# Patient Record
Sex: Male | Born: 1970 | Race: White | Hispanic: No | Marital: Single | Smoking: Never smoker
Health system: Southern US, Community
[De-identification: ages and names within clinical notes are randomized; demographics above are authoritative.]

## PROBLEM LIST (undated history)

## (undated) DIAGNOSIS — E781 Pure hyperglyceridemia: Secondary | ICD-10-CM

## (undated) DIAGNOSIS — I10 Essential (primary) hypertension: Secondary | ICD-10-CM

## (undated) DIAGNOSIS — I456 Pre-excitation syndrome: Secondary | ICD-10-CM

## (undated) DIAGNOSIS — K5730 Diverticulosis of large intestine without perforation or abscess without bleeding: Secondary | ICD-10-CM

## (undated) DIAGNOSIS — K602 Anal fissure, unspecified: Secondary | ICD-10-CM

## (undated) DIAGNOSIS — B001 Herpesviral vesicular dermatitis: Secondary | ICD-10-CM

## (undated) DIAGNOSIS — K589 Irritable bowel syndrome without diarrhea: Secondary | ICD-10-CM

## (undated) HISTORY — DX: Pre-excitation syndrome: I45.6

## (undated) HISTORY — DX: Irritable bowel syndrome, unspecified: K58.9

## (undated) HISTORY — DX: Diverticulosis of large intestine without perforation or abscess without bleeding: K57.30

## (undated) HISTORY — PX: OTHER SURGICAL HISTORY: SHX169

## (undated) HISTORY — DX: Herpesviral vesicular dermatitis: B00.1

## (undated) HISTORY — DX: Anal fissure, unspecified: K60.2

## (undated) HISTORY — DX: Essential (primary) hypertension: I10

## (undated) HISTORY — DX: Pure hyperglyceridemia: E78.1

---
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
## 1997-08-19 ENCOUNTER — Emergency Department (HOSPITAL_COMMUNITY): Admission: EM | Admit: 1997-08-19 | Discharge: 1997-08-19 | Payer: Self-pay | Admitting: Emergency Medicine

## 2001-02-01 ENCOUNTER — Encounter: Admission: RE | Admit: 2001-02-01 | Discharge: 2001-02-01 | Payer: Self-pay | Admitting: Gastroenterology

## 2001-02-01 ENCOUNTER — Encounter: Payer: Self-pay | Admitting: Gastroenterology

## 2003-09-07 ENCOUNTER — Emergency Department (HOSPITAL_COMMUNITY): Admission: EM | Admit: 2003-09-07 | Discharge: 2003-09-07 | Payer: Self-pay | Admitting: Family Medicine

## 2005-12-03 ENCOUNTER — Ambulatory Visit: Payer: Self-pay | Admitting: Family Medicine

## 2007-11-01 ENCOUNTER — Ambulatory Visit: Payer: Self-pay | Admitting: Family Medicine

## 2009-05-15 ENCOUNTER — Ambulatory Visit: Payer: Self-pay | Admitting: Family Medicine

## 2009-06-13 ENCOUNTER — Ambulatory Visit: Payer: Self-pay | Admitting: Family Medicine

## 2009-12-10 ENCOUNTER — Ambulatory Visit: Payer: Self-pay | Admitting: Family Medicine

## 2010-03-17 ENCOUNTER — Ambulatory Visit
Admission: RE | Admit: 2010-03-17 | Discharge: 2010-03-17 | Payer: Self-pay | Source: Home / Self Care | Attending: Family Medicine | Admitting: Family Medicine

## 2010-10-29 ENCOUNTER — Encounter: Payer: Self-pay | Admitting: Family Medicine

## 2011-03-03 ENCOUNTER — Ambulatory Visit (INDEPENDENT_AMBULATORY_CARE_PROVIDER_SITE_OTHER): Payer: BC Managed Care – PPO | Admitting: Medical

## 2011-03-03 ENCOUNTER — Encounter: Payer: Self-pay | Admitting: Medical

## 2011-03-03 DIAGNOSIS — R6882 Decreased libido: Secondary | ICD-10-CM | POA: Insufficient documentation

## 2011-03-03 DIAGNOSIS — R209 Unspecified disturbances of skin sensation: Secondary | ICD-10-CM

## 2011-03-03 DIAGNOSIS — R202 Paresthesia of skin: Secondary | ICD-10-CM | POA: Insufficient documentation

## 2011-03-03 DIAGNOSIS — Z0000 Encounter for general adult medical examination without abnormal findings: Secondary | ICD-10-CM | POA: Insufficient documentation

## 2011-03-03 DIAGNOSIS — E781 Pure hyperglyceridemia: Secondary | ICD-10-CM | POA: Insufficient documentation

## 2011-03-03 DIAGNOSIS — Z113 Encounter for screening for infections with a predominantly sexual mode of transmission: Secondary | ICD-10-CM | POA: Insufficient documentation

## 2011-03-03 LAB — CBC WITH DIFFERENTIAL/PLATELET
Basophils Absolute: 0.1 10*3/uL (ref 0.0–0.1)
Basophils Relative: 1 % (ref 0–1)
Eosinophils Absolute: 0.1 10*3/uL (ref 0.0–0.7)
Eosinophils Relative: 2 % (ref 0–5)
HCT: 46.2 % (ref 39.0–52.0)
Hemoglobin: 15.8 g/dL (ref 13.0–17.0)
Lymphocytes Relative: 43 % (ref 12–46)
Lymphs Abs: 2.4 10*3/uL (ref 0.7–4.0)
MCH: 33 pg (ref 26.0–34.0)
MCHC: 34.2 g/dL (ref 30.0–36.0)
MCV: 96.5 fL (ref 78.0–100.0)
Monocytes Absolute: 0.6 10*3/uL (ref 0.1–1.0)
Monocytes Relative: 11 % (ref 3–12)
Neutro Abs: 2.5 10*3/uL (ref 1.7–7.7)
Neutrophils Relative %: 44 % (ref 43–77)
Platelets: 177 10*3/uL (ref 150–400)
RBC: 4.79 MIL/uL (ref 4.22–5.81)
RDW: 12.1 % (ref 11.5–15.5)
WBC: 5.6 10*3/uL (ref 4.0–10.5)

## 2011-03-03 LAB — LIPID PANEL
Cholesterol: 237 mg/dL — ABNORMAL HIGH (ref 0–200)
HDL: 39 mg/dL — ABNORMAL LOW (ref >39)
LDL Cholesterol: 136 mg/dL — ABNORMAL HIGH (ref 0–99)
Total CHOL/HDL Ratio: 6.1 Ratio
Triglycerides: 312 mg/dL — ABNORMAL HIGH (ref <150)
VLDL: 62 mg/dL — ABNORMAL HIGH (ref 0–40)

## 2011-03-03 LAB — COMPREHENSIVE METABOLIC PANEL
ALT: 26 U/L (ref 0–53)
AST: 23 U/L (ref 0–37)
Albumin: 4.8 g/dL (ref 3.5–5.2)
Alkaline Phosphatase: 107 U/L (ref 39–117)
BUN: 13 mg/dL (ref 6–23)
CO2: 26 mEq/L (ref 19–32)
Calcium: 9.6 mg/dL (ref 8.4–10.5)
Chloride: 101 mEq/L (ref 96–112)
Creat: 1 mg/dL (ref 0.50–1.35)
Glucose, Bld: 107 mg/dL — ABNORMAL HIGH (ref 70–99)
Potassium: 4.2 mEq/L (ref 3.5–5.3)
Sodium: 141 mEq/L (ref 135–145)
Total Bilirubin: 0.6 mg/dL (ref 0.3–1.2)
Total Protein: 7.5 g/dL (ref 6.0–8.3)

## 2011-03-03 LAB — POCT URINALYSIS DIPSTICK
Bilirubin, UA: NEGATIVE
Blood, UA: NEGATIVE
Glucose, UA: NEGATIVE
Ketones, UA: NEGATIVE
Leukocytes, UA: NEGATIVE
Nitrite, UA: NEGATIVE
Protein, UA: NEGATIVE
Spec Grav, UA: 1.015
Urobilinogen, UA: NEGATIVE
pH, UA: 7

## 2011-03-03 LAB — TESTOSTERONE: Testosterone: 419.27 ng/dL (ref 250–890)

## 2011-03-03 LAB — VITAMIN B12: Vitamin B-12: 460 pg/mL (ref 211–911)

## 2011-03-03 MED ORDER — MUPIROCIN 2 % EX OINT: TOPICAL_OINTMENT | CUTANEOUS | Status: DC
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
## 2011-03-04 LAB — GC/CHLAMYDIA PROBE AMP, URINE
Chlamydia, Swab/Urine, PCR: NEGATIVE
GC Probe Amp, Urine: NEGATIVE

## 2011-03-04 LAB — HIV ANTIBODY (ROUTINE TESTING W REFLEX): HIV: NONREACTIVE

## 2011-03-10 ENCOUNTER — Telehealth: Payer: Self-pay | Admitting: Family Medicine

## 2011-03-10 NOTE — Telephone Encounter (Signed)
Message copied by Janeice Robinson on Wed Mar 10, 2011  4:49 PM ------      Message from: Aleen Campi, DAVID S      Created: Thu Mar 04, 2011  5:59 AM       Labs show elevated glucose at 107, high triglycerides, low good chol.  His blood counts, urine, liver, kidney, lytes, B12, and testosterone were all normal.  His HIV was negative.  Gonorrhea and chlamydia still pending.            Given the triglycerides, I would recommend he be more careful with his diet in regards to sweets and fatty foods.  I recommend he exercise most days of the week, preferably some form of exercise that doesn't seem to make his feet feel numb.  I also recommend we begin a medication such as Pravastatin to help lower his cholesterol and triglycerides.  See if agreeable?            I recommend he try OTC Glucosamine/Chondroitin for knee aches.  Take this daily.            See if he is interested in counseling for the sexual concerns he had?             We will call with GC/Chlamydia results.

## 2011-03-10 NOTE — Telephone Encounter (Signed)
Patient has decided to try diet changes and exercise to reduce his cholesterol. He has an appt. To f/u with you on 06/07/11 to see if his life style changes are helping. cls   Patient was notified of his lab results. cls

## 2011-06-07 ENCOUNTER — Ambulatory Visit (INDEPENDENT_AMBULATORY_CARE_PROVIDER_SITE_OTHER): Payer: BC Managed Care – PPO | Admitting: Medical

## 2011-06-07 ENCOUNTER — Encounter: Payer: Self-pay | Admitting: Medical

## 2011-06-07 VITALS — BP 122/88 | HR 60 | Temp 97.8°F | Resp 14 | Wt 192.0 lb

## 2011-06-07 DIAGNOSIS — E781 Pure hyperglyceridemia: Secondary | ICD-10-CM

## 2011-06-07 DIAGNOSIS — R7301 Impaired fasting glucose: Secondary | ICD-10-CM

## 2011-06-07 LAB — LIPID PANEL
Cholesterol: 201 mg/dL — ABNORMAL HIGH (ref 0–200)
HDL: 36 mg/dL — ABNORMAL LOW (ref >39)
LDL Cholesterol: 116 mg/dL — ABNORMAL HIGH (ref 0–99)
Total CHOL/HDL Ratio: 5.6 Ratio
Triglycerides: 245 mg/dL — ABNORMAL HIGH (ref <150)
VLDL: 49 mg/dL — ABNORMAL HIGH (ref 0–40)

## 2011-06-07 NOTE — Progress Notes (Signed)
Subjective:   HPI  Adam Glover is a 41 y.o. male who presents for recheck on cholesterol and triglycerides.  I last saw him in December for complete physical, and at that time his lipids were elevated particularly the triglycerides.  The plan at that time was for him to work on lifestyle changes and return for fasting labs which is why he is here today. He did not like the idea of taking medication mainly from the standpoint of side effects.  In general he says that his diet is probably not as good as it should be. He does a lot ice cream, eats a lot of red meat, eats processed meat such as [place] and ham every day, but he does exercise some.  No other aggravating or relieving factors.  He has a history of vitamin D deficiency. He has not been taking his vitamin D regularly.  No other c/o.  The following portions of the patient's history were reviewed and updated as appropriate: allergies, current medications, past family history, past medical history, past social history, past surgical history and problem list.  Past Medical History  Diagnosis Date  . Herpes labialis   . Hypertriglyceridemia   . Seborrheic dermatitis   . Diverticulosis of colon     hx/o diverticulitis x 1  . WPW (Wolff-Parkinson-White syndrome) 1990    ablation  . Vitamin d deficiency   . Tinnitus     Allergies  Allergen Reactions  . Ilosone (Erythromycin Estolate)     Review of Systems ROS reviewed and was negative other than noted in HPI or above.    Objective:   Physical Exam  General appearance: alert, no distress, WD/WN Heart: RRR, normal S1, S2, no murmurs Lungs: CTA bilaterally, no wheezes, rhonchi, or rales Abdomen: +bs, soft, non tender, non distended, no masses, no hepatomegaly, no splenomegaly Pulses: 2+ symmetric   Assessment and Plan :     Encounter Diagnoses  Name Primary?  . Hypertriglyceridemia Yes  . Impaired fasting blood sugar    We discussed diet at length, and general advised  that he heat whole-grains such as oatmeal and pasta regularly, limit red meat, limit processed meats, limit ice cream.  In general eat fruits and vegetables, lean meats, fish and almonds, drink plenty of water daily, and exercise regularly.  We will recheck labs today fasting and call with results

## 2011-06-07 NOTE — Patient Instructions (Signed)
Take Vitamin D 600- 1000 IU daily.

## 2011-06-08 NOTE — Progress Notes (Signed)
Lmom to cb. CLS 

## 2011-06-14 ENCOUNTER — Telehealth: Payer: Self-pay | Admitting: Family Medicine

## 2011-06-15 NOTE — Telephone Encounter (Signed)
Done

## 2012-03-10 ENCOUNTER — Encounter: Payer: Self-pay | Admitting: Family Medicine

## 2012-03-10 ENCOUNTER — Ambulatory Visit (INDEPENDENT_AMBULATORY_CARE_PROVIDER_SITE_OTHER): Payer: Managed Care, Other (non HMO) | Admitting: Family Medicine

## 2012-03-10 VITALS — BP 130/88 | HR 82 | Wt 198.0 lb

## 2012-03-10 DIAGNOSIS — Z209 Contact with and (suspected) exposure to unspecified communicable disease: Secondary | ICD-10-CM

## 2012-03-10 DIAGNOSIS — N342 Other urethritis: Secondary | ICD-10-CM

## 2012-03-10 MED ORDER — DOXYCYCLINE HYCLATE 100 MG PO TABS: 100.0000 mg | ORAL_TABLET | Freq: Two times a day (BID) | ORAL | Status: DC

## 2012-03-10 NOTE — Progress Notes (Signed)
  Subjective:    Patient ID: Adam Glover, male    DOB: 08/05/1970, 42 y.o.   MRN: [id number]  HPI He had unprotected oral sex approximately one week ago and several days after that did note some slight urethral tingling but no discharge. His last sexual encounter prior to that was last March. He would like to be HIV tested in regard to the March encounter.   Review of Systems     Objective:   Physical Exam Alert and in no distress otherwise not examined       Assessment & Plan:   1. Contact with or exposure to unspecified communicable disease  GC/chlamydia probe amp, urine, HIV Antibody  2. Urethritis  doxycycline (VIBRA-TABS) 100 MG tablet   have decided to treat him with doxycycline since he is having some symptoms but will wait to more definitively treat based on his results.

## 2012-03-13 NOTE — Progress Notes (Signed)
Quick Note:  CALLED PT CELL PT INFORMED AND VERBALIZED UNDERSTANDING ______

## 2012-03-13 NOTE — Progress Notes (Signed)
Quick Note:  Let him know that the labs are all negative. Have him complete the course of the antibiotic that I gave him ______

## 2012-03-20 ENCOUNTER — Ambulatory Visit (INDEPENDENT_AMBULATORY_CARE_PROVIDER_SITE_OTHER): Payer: Managed Care, Other (non HMO) | Admitting: Family Medicine

## 2012-03-20 ENCOUNTER — Encounter: Payer: Self-pay | Admitting: Family Medicine

## 2012-03-20 VITALS — BP 128/86 | HR 90 | Ht 75.0 in | Wt 200.0 lb

## 2012-03-20 DIAGNOSIS — K602 Anal fissure, unspecified: Secondary | ICD-10-CM

## 2012-03-20 DIAGNOSIS — I456 Pre-excitation syndrome: Secondary | ICD-10-CM

## 2012-03-20 DIAGNOSIS — Z23 Encounter for immunization: Secondary | ICD-10-CM

## 2012-03-20 DIAGNOSIS — Z0000 Encounter for general adult medical examination without abnormal findings: Secondary | ICD-10-CM

## 2012-03-20 DIAGNOSIS — R197 Diarrhea, unspecified: Secondary | ICD-10-CM

## 2012-03-20 LAB — CBC WITH DIFFERENTIAL/PLATELET
Eosinophils Relative: 1 % (ref 0–5)
HCT: 45.4 % (ref 39.0–52.0)
Hemoglobin: 16 g/dL (ref 13.0–17.0)
Lymphocytes Relative: 41 % (ref 12–46)
Lymphs Abs: 2 10*3/uL (ref 0.7–4.0)
MCV: 97.6 fL (ref 78.0–100.0)
Monocytes Absolute: 0.5 10*3/uL (ref 0.1–1.0)
Monocytes Relative: 11 % (ref 3–12)
Neutro Abs: 2.3 10*3/uL (ref 1.7–7.7)
RBC: 4.65 MIL/uL (ref 4.22–5.81)
RDW: 13.5 % (ref 11.5–15.5)
WBC: 4.9 10*3/uL (ref 4.0–10.5)

## 2012-03-20 LAB — POCT URINALYSIS DIPSTICK
Bilirubin, UA: NEGATIVE
Blood, UA: NEGATIVE
Glucose, UA: NEGATIVE
Leukocytes, UA: NEGATIVE
Nitrite, UA: NEGATIVE
Urobilinogen, UA: NEGATIVE
pH, UA: 6

## 2012-03-20 LAB — COMPREHENSIVE METABOLIC PANEL
AST: 20 U/L (ref 0–37)
Albumin: 4.6 g/dL (ref 3.5–5.2)
BUN: 17 mg/dL (ref 6–23)
CO2: 28 mEq/L (ref 19–32)
Calcium: 9.6 mg/dL (ref 8.4–10.5)
Chloride: 102 mEq/L (ref 96–112)
Creat: 1.09 mg/dL (ref 0.50–1.35)
Potassium: 4 mEq/L (ref 3.5–5.3)

## 2012-03-20 LAB — LIPID PANEL
Cholesterol: 199 mg/dL (ref 0–200)
LDL Cholesterol: 129 mg/dL — ABNORMAL HIGH (ref 0–99)
Triglycerides: 163 mg/dL — ABNORMAL HIGH (ref <150)

## 2012-03-20 LAB — HEMOCCULT GUIAC POC 1CARD (OFFICE)

## 2012-03-20 NOTE — Progress Notes (Signed)
Subjective:    Patient ID: Adam Glover, male    DOB: 1970/04/14, 42 y.o.   MRN: [id number]  HPI He is here for complete examination. He has a long history of difficulty with intermittent diarrhea. He can have normal stools for several days and then have loose stools. He cannot relate this to stressful situations, and a particular foods. He doesn't drink or regularly and cannot related to alcohol. He also has a previous history of WPW with an ablation in 1990. He does occasionally still have palpitations and has noted this more recently. He also had difficulty recently with discomfort with bowel movements and occasional bleeding. Does have a previous history of diverticulosis.   Review of Systems  Constitutional: Negative.   HENT: Negative.   Eyes: Negative.   Respiratory: Negative.   Cardiovascular: Negative.   Gastrointestinal: Positive for anal bleeding.  Genitourinary: Negative.   Musculoskeletal: Negative.   Skin: Negative.   Neurological: Negative.   Psychiatric/Behavioral: Negative.        Objective:   Physical Exam BP 128/86  Pulse 90  Ht 6\' 3"  (1.905 m)  Wt 200 lb (90.719 kg)  BMI 25.00 kg/m2  General Appearance:    Alert, cooperative, no distress, appears stated age  Head:    Normocephalic, without obvious abnormality, atraumatic  Eyes:    PERRL, conjunctiva/corneas clear, EOM's intact, fundi    benign  Ears:    Normal TM's and external ear canals  Nose:   Nares normal, mucosa normal, no drainage or sinus   tenderness  Throat:   Lips, mucosa, and tongue normal; teeth and gums normal  Neck:   Supple, no lymphadenopathy;  thyroid:  no   enlargement/tenderness/nodules; no carotid   bruit or JVD  Back:    Spine nontender, no curvature, ROM normal, no CVA     tenderness  Lungs:     Clear to auscultation bilaterally without wheezes, rales or     ronchi; respirations unlabored  Chest Wall:    No tenderness or deformity   Heart:    Regular rate and rhythm, S1 and S2  normal, no murmur, rub   or gallop  Breast Exam:    No chest wall tenderness, masses or gynecomastia  Abdomen:     Soft, non-tender, nondistended, normoactive bowel sounds,    no masses, no hepatosplenomegaly  Genitalia:    Normal male external genitalia without lesions.  Testicles without masses.  No inguinal hernias.  Rectal:    Normal sphincter tone, no masses, fissure noted at 6:00 however it appears to be healing; guaiac negative stool.  Prostate smooth, no nodules, not enlarged.  Extremities:   No clubbing, cyanosis or edema  Pulses:   2+ and symmetric all extremities  Skin:   Skin color, texture, turgor normal, no rashes or lesions  Lymph nodes:   Cervical, supraclavicular, and axillary nodes normal  Neurologic:   CNII-XII intact, normal strength, sensation and gait; reflexes 2+ and symmetric throughout          Psych:   Normal mood, affect, hygiene and grooming.          Assessment & Plan:   1. Diarrhea  Celiac panel  2. Routine general medical examination at a health care facility  Flu vaccine greater than or equal to 3yo preservative free IM, Lipid panel, CBC with Differential, Comprehensive metabolic panel, POCT Urinalysis Dipstick  3. WPW (Wolff-Parkinson-White syndrome)  PR ELECTROCARDIOGRAM, COMPLETE  4. Anal fissure     cuss him keeping  better check of his loose stools in regard to stress, eating habits. I will check a celiac panel. Flu shot given with risks and benefits. I will discuss his EKG with cardiology. Recommend sitz bath for the anal fissure and refrain from sexual activity for the next week or 2.

## 2012-03-21 LAB — GLIA (IGA/G) + TTG IGA
Gliadin IgA: 5.7 U/mL (ref <20)
Gliadin IgG: 3.9 U/mL (ref <20)
Tissue Transglutaminase Ab, IgA: 4.6 U/mL (ref <20)

## 2012-03-21 NOTE — Progress Notes (Signed)
Quick Note:  CALLED PT CELL# LEFT MESSAGE FOR PT TO CALL BACK . ______

## 2012-03-23 ENCOUNTER — Other Ambulatory Visit: Payer: Self-pay

## 2012-03-23 MED ORDER — VALACYCLOVIR HCL 1 G PO TABS: 1000.0000 mg | ORAL_TABLET | Freq: Two times a day (BID) | ORAL | Status: DC

## 2012-03-23 NOTE — Telephone Encounter (Signed)
Pt had cold sore jcl ok for valtrex for pt sent med in

## 2012-03-23 NOTE — Progress Notes (Signed)
Quick Note:  CALLED PT HOME/CELL # LEFT HIM MESSAGE OF LAB RESULTS AND OF APT WITH [facility] DR.TAYLOR JAN 29 AT 2:30 [phone] ______

## 2012-03-27 ENCOUNTER — Telehealth: Payer: Self-pay | Admitting: Family Medicine

## 2012-03-27 NOTE — Telephone Encounter (Signed)
PT INFORMED ANY OTC MEDS WILL BE FINE PER JCL

## 2012-03-27 NOTE — Telephone Encounter (Signed)
Any over-the-counter decongestant will be fine for right now

## 2012-03-27 NOTE — Telephone Encounter (Signed)
Pt called and stated that since his appointment here he has developed issues with sinus pressure. Pt is requested something otc to help relieve his sinus pressure. Pt states he is currently taking 1000 mg of valtrex and wants to make sure it is compatible with that medication. Please call pt.

## 2012-04-05 ENCOUNTER — Encounter: Payer: Self-pay | Admitting: Internal Medicine

## 2012-04-05 ENCOUNTER — Ambulatory Visit (INDEPENDENT_AMBULATORY_CARE_PROVIDER_SITE_OTHER): Payer: Managed Care, Other (non HMO) | Admitting: Internal Medicine

## 2012-04-05 VITALS — BP 146/90 | HR 85 | Ht 75.0 in | Wt 200.1 lb

## 2012-04-05 DIAGNOSIS — I456 Pre-excitation syndrome: Secondary | ICD-10-CM | POA: Insufficient documentation

## 2012-04-05 NOTE — Patient Instructions (Addendum)
Your physician wants you to follow-up in: 6 months with Dr Taylor You will receive a reminder letter in the mail two months in advance. If you don't receive a letter, please call our office to schedule the follow-up appointment.  

## 2012-04-05 NOTE — Progress Notes (Signed)
HPI [name] is referred today for evaluation of WPW syndrome. He is a 42 year old man with a long-standing history of tachycardia palpitations and documented SVT. He underwent catheter ablation in 1990. At that time, by his report, the ablation procedure was notable that his accessory pathway could not be ablated but his normal pathway reportedly was ablated. I do not have the actual operative note from that procedure. Since then he has occasionally had palpitations but no sustained heart racing. He has a sensation that his heart will flip-flop in his chest. Allergies  Allergen Reactions  . Ilosone (Erythromycin Estolate)      Current Outpatient Prescriptions  Medication Sig Dispense Refill  . fish oil-omega-3 fatty acids 1000 MG capsule Take 2 g by mouth daily.        . Multiple Vitamin (MULTIVITAMIN) tablet Take 1 tablet by mouth daily.        . Probiotic Product (PROBIOTIC FORMULA PO) Take by mouth.      . doxycycline (VIBRA-TABS) 100 MG tablet Take 1 tablet (100 mg total) by mouth 2 (two) times daily.  20 tablet  0  . valACYclovir (VALTREX) 1000 MG tablet Take 1 tablet (1,000 mg total) by mouth 2 (two) times daily.  30 tablet  1     Past Medical History  Diagnosis Date  . Herpes labialis   . Hypertriglyceridemia   . Seborrheic dermatitis   . Diverticulosis of colon     hx/o diverticulitis x 1  . WPW (Wolff-Parkinson-White syndrome) 1990    ablation  . Vitamin D deficiency   . Tinnitus     ROS:   All systems reviewed and negative except as noted in the HPI.   Past Surgical History  Procedure Date  . Cardiac electrophysiology mapping and ablation 1990    hx/o WPW     Family History  Problem Relation Age of Onset  . Cancer Maternal Grandmother   . Hypertension Maternal Grandmother   . Dementia Maternal Grandmother   . Cancer Paternal Grandmother   . Diabetes Paternal Grandmother   . Stroke Paternal Grandmother   . Cancer Paternal Grandfather   . Cancer Mother      breast cancer  . Diabetes Mother   . Cancer Father 22    prostate  . Seizures Father   . Heart disease Father     BBB  . Vitamin D deficiency Father   . Vitamin D deficiency Brother      History   Social History  . Marital Status: Single    Spouse Name: N/A    Number of Children: N/A  . Years of Education: N/A   Occupational History  . Not on file.   Social History Main Topics  . Smoking status: Never Smoker   . Smokeless tobacco: Not on file  . Alcohol Use: 6.0 oz/week    10 Cans of beer per week  . Drug Use: No  . Sexually Active: Yes    Birth Control/ Protection: Condom   Other Topics Concern  . Not on file   Social History Narrative   In relationship with another male x 1+ year, but hx/o multiple sexual partners, not exercising     BP 146/90  Pulse 85  Ht 6\' 3"  (1.905 m)  Wt 200 lb 1.9 oz (90.774 kg)  BMI 25.01 kg/m2  Physical Exam:  Well appearing 42 year old man, NAD HEENT: Unremarkable Neck:  No JVD, no thyromegally Lungs:  Clear with no wheezes, rales, or rhonchi. HEART:  Regular rate rhythm, no murmurs, no rubs, no clicks Abd:  soft, positive bowel sounds, no organomegally, no rebound, no guarding Ext:  2 plus pulses, no edema, no cyanosis, no clubbing Skin:  No rashes no nodules Neuro:  CN II through XII intact, motor grossly intact  EKG Normal sinus rhythm with WPW pattern. The extensor pathway appears to be right mid septal.  Assess/Plan:

## 2012-04-05 NOTE — Assessment & Plan Note (Signed)
I've discussed the treatment options with the patient. I've asked him to try to obtain additional records if any from [facility]. If his AV node has been inadvertently damaged by catheter ablation, RF energy applied to the accessory pathway would likely result in complete heart block. Unfortunately, the patient may be having paroxysmal atrial fibrillation. We have no documentation however. In general his symptoms are much better than they were prior to ablation. Over time, patients with antegrade accessory pathway conduction will often lose their pathway conduction. This would result presumably in complete heart block. I've asked the patient to notify us if he has syncope, near syncope, or worsening tachypalpitations. At this point I have not recommended repeat catheter ablation. He may ultimately require beta blocker therapy to help control his symptoms.

## 2012-05-31 ENCOUNTER — Ambulatory Visit (INDEPENDENT_AMBULATORY_CARE_PROVIDER_SITE_OTHER): Payer: Managed Care, Other (non HMO) | Admitting: Family Medicine

## 2012-05-31 ENCOUNTER — Encounter: Payer: Self-pay | Admitting: Family Medicine

## 2012-05-31 VITALS — BP 128/86 | HR 72 | Ht 74.5 in | Wt 198.0 lb

## 2012-05-31 DIAGNOSIS — K612 Anorectal abscess: Secondary | ICD-10-CM

## 2012-05-31 DIAGNOSIS — K611 Rectal abscess: Secondary | ICD-10-CM

## 2012-05-31 MED ORDER — METRONIDAZOLE 500 MG PO TABS: 500.0000 mg | ORAL_TABLET | Freq: Four times a day (QID) | ORAL | Status: DC

## 2012-05-31 MED ORDER — SULFAMETHOXAZOLE-TRIMETHOPRIM 800-160 MG PO TABS: 1.0000 | ORAL_TABLET | Freq: Two times a day (BID) | ORAL | Status: DC

## 2012-05-31 NOTE — Patient Instructions (Addendum)
I believe you MAY have an early peri-rectal abscess.  We will treat with antibiotics and soaks (sitz baths), and ask you to return for follow-up next week.  If swelling increases, then we may need to drain the area.  Take the antibiotics as directed.  Return sooner if having fevers, worsening swelling, pain, redness develops, or other concerns.  Do NOT drink any alcohol until at least 24 hours after completing the course of metronidazole.  Sitz Bath A sitz bath is a warm water bath taken in the sitting position that covers only the hips and buttocks. It may be used for either healing or hygiene purposes. Sitz baths are also used to relieve pain, itching, or muscle spasms. The water may contain medicine. Moist heat will help you heal and relax.  HOME CARE INSTRUCTIONS   Fill the bathtub half full with warm water.  Sit in the water and open the drain a little.  Turn on the warm water to keep the tub half full. Keep the water running constantly.  Soak in the water for 15 to 20 minutes.  After the sitz bath, pat the affected area dry first.  Take 3 to 4 sitz baths a day. SEEK MEDICAL CARE IF:  You get worse instead of better. Stop the sitz baths if you get worse. MAKE SURE YOU:  Understand these instructions.  Will watch your condition.  Will get help right away if you are not doing well or get worse. Document Released: 11/15/2003 Document Revised: 05/17/2011 Document Reviewed: 05/22/2010 [facility] Patient Information 2013 [place], [place].  Peri-Rectal Abscess Your caregiver has diagnosed you as having a peri-rectal abscess. This is an infected area near the rectum that is filled with pus. If the abscess is near the surface of the skin, your caregiver may open (incise) the area and drain the pus. HOME CARE INSTRUCTIONS   If your abscess was opened up and drained. A small piece of gauze may be placed in the opening so that it can drain. Do not remove the gauze unless directed by your  caregiver.  A loose dressing may be placed over the abscess site. Change the dressing as often as necessary to keep it clean and dry.  After the drain is removed, the area may be washed with a gentle antiseptic (soap) four times per day.  A warm sitz bath, warm packs or heating pad may be used for pain relief, taking care not to burn yourself.  Return for a wound check in 1 day or as directed.  An "inflatable doughnut" may be used for sitting with added comfort. These can be purchased at a drugstore or medical supply house.  To reduce pain and straining with bowel movements, eat a high fiber diet with plenty of fruits and vegetables. Use stool softeners as recommended by your caregiver. This is especially important if narcotic type pain medications were prescribed as these may cause marked constipation.  Only take over-the-counter or prescription medicines for pain, discomfort, or fever as directed by your caregiver. SEEK IMMEDIATE MEDICAL CARE IF:   You have increasing pain that is not controlled by medication.  There is increased inflammation (redness), swelling, bleeding, or drainage from the area.  An oral temperature above 102 F (38.9 C) develops.  You develop chills or generalized malaise (feel lethargic or feel "washed out").  You develop any new symptoms (problems) you feel may be related to your present problem. Document Released: 02/20/2000 Document Revised: 05/17/2011 Document Reviewed: 02/20/2008 [employer] Patient Information 2013  [employer].

## 2012-05-31 NOTE — Progress Notes (Signed)
Chief Complaint  Patient presents with  . Mass    has a place about the size of a quarter on his anal area. Thinks it started about 10 years ago and has grown and is increasing in size, painful to push on.    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  After that time he developed problems with internal hemorrhoids.  Once felt a "tear" after squeezing sphincter very hard (about 10 years ago).  Since that time, if squeezes too hard he has discomfort and notices a slight bulge in left perianal area.   +loose stools over the last week.  He noticed tender knot on the left side for about a week. Feels like his gas is very acidic, and can sometimes get rashes just related to passing gas.   Past Medical History  Diagnosis Date  . Herpes labialis   . Hypertriglyceridemia   . Seborrheic dermatitis   . Diverticulosis of colon     hx/o diverticulitis x 1  . WPW (Wolff-Parkinson-White syndrome) 1990    ablation  . Vitamin D deficiency   . Tinnitus    Past Surgical History  Procedure Laterality Date  . Cardiac electrophysiology mapping and ablation  1990    hx/o WPW   History   Social History  . Marital Status: Single    Spouse Name: N/A    Number of Children: N/A  . Years of Education: N/A   Occupational History  . Not on file.   Social History Main Topics  . Smoking status: Never Smoker   . Smokeless tobacco: Not on file  . Alcohol Use: 6.0 oz/week    10 Cans of beer per week     Comment: 10 drinks per month.  . Drug Use: No  . Sexually Active: Yes    Birth Control/ Protection: Condom   Other Topics Concern  . Not on file   Social History Narrative   In relationship with another male x 1+ year, but hx/o multiple sexual partners, not exercising   Current Outpatient Prescriptions on File Prior to Visit  Medication Sig Dispense Refill  . Multiple Vitamin (MULTIVITAMIN) tablet Take 1 tablet by mouth daily.        . Probiotic Product (PROBIOTIC FORMULA PO) Take  by mouth.      . valACYclovir (VALTREX) 1000 MG tablet Take 1 tablet (1,000 mg total) by mouth 2 (two) times daily.  30 tablet  1   No current facility-administered medications on file prior to visit.   Allergies  Allergen Reactions  . Ilosone (Erythromycin Estolate)    ROS:  Denies fevers, abdominal pain, bloody or mucus in stools.  Denies nausea, vomiting, other skin concerns.  Occasional palpitations, h/o WPW.  Denies chest pain, shortness of breath, urinary complaints.  PHYSICAL EXAM: BP 128/86  Pulse 72  Ht 6' 2.5" (1.892 m)  Wt 198 lb (89.812 kg)  BMI 25.09 kg/m2 Well developed, somewhat anxious, pleasant male in no distress Heart: regular rate and rhythm without murmur Lungs: clear bilaterally Abdomen: soft, nontender, no mass Rectal: focal area of slight firmness, which is tender to palpation in left perineal area.  No overlying erythema.  Rectal exam--normal sphincter tone, normal prostate, no tenderness, mass or swelling internally  ASSESSMENT/PLAN: Perirectal abscess - Plan: sulfamethoxazole-trimethoprim (BACTRIM DS,SEPTRA DS) 800-160 MG per tablet, metroNIDAZOLE (FLAGYL) 500 MG tablet  Suspect early perirectal abscess Sitz baths and treat with antibiotics. F/u with Dr. Susann Givens in a week if not improving, sooner  if worse  Reviewed risks of untreated infection (but also discussed DDx, and that if there is abscess, is VERY early, treating presumptively). Reviewed side effects of medications.  Pt had many questions, all of which were answered

## 2012-06-08 ENCOUNTER — Encounter: Payer: Self-pay | Admitting: Family Medicine

## 2012-06-08 ENCOUNTER — Ambulatory Visit (INDEPENDENT_AMBULATORY_CARE_PROVIDER_SITE_OTHER): Payer: Managed Care, Other (non HMO) | Admitting: Family Medicine

## 2012-06-08 DIAGNOSIS — F5221 Male erectile disorder: Secondary | ICD-10-CM

## 2012-06-08 DIAGNOSIS — K612 Anorectal abscess: Secondary | ICD-10-CM

## 2012-06-08 DIAGNOSIS — F528 Other sexual dysfunction not due to a substance or known physiological condition: Secondary | ICD-10-CM

## 2012-06-08 MED ORDER — VARDENAFIL HCL 20 MG PO TABS: 20.0000 mg | ORAL_TABLET | Freq: Every day | ORAL | Status: DC | PRN

## 2012-06-08 NOTE — Progress Notes (Signed)
  Subjective:    Patient ID: Adam Glover, male    DOB: 1971-01-17, 42 y.o.   MRN: [id number]  HPI He is here for recheck. He he states that the antibiotic has quieted things down and presently he is having only minimal rectal discomfort. He also complains of difficulty with erections. He says that he normal erections in the morning and when he is by himself but when he is involved with sexual activity, he does note difficulty.   Review of Systems     Objective:   Physical Exam Alert and in no distress. Exam of the rectal area shows no tenderness erythema or swelling. Digital rectal exam shows no fullness.       Assessment & Plan:  Abscess of anal and rectal regions  ED (erectile dysfunction) of non-organic origin I reassured him that I did not think that this was a sign of any other bad problem going on. If he has difficulty with this in the future we might need to pursue other etiologies. I discussed erectile dysfunction with him in regard to the psychological component of this. Suggested that his issues more psychological than truly physiologic since he is able to get and maintain erections. A sample of Levitra given with instructions on benefits and risks. Also discussed the fact that I did not think he would need this on a regular basis nor would I give it to him on regular basis.

## 2012-09-05 ENCOUNTER — Encounter: Payer: Self-pay | Admitting: Medical

## 2012-09-05 ENCOUNTER — Ambulatory Visit (INDEPENDENT_AMBULATORY_CARE_PROVIDER_SITE_OTHER): Payer: Managed Care, Other (non HMO) | Admitting: Family Medicine

## 2012-09-05 VITALS — BP 122/90 | HR 92 | Temp 98.4°F | Resp 18 | Wt 194.0 lb

## 2012-09-05 DIAGNOSIS — Z209 Contact with and (suspected) exposure to unspecified communicable disease: Secondary | ICD-10-CM

## 2012-09-05 DIAGNOSIS — L0882 Omphalitis not of newborn: Secondary | ICD-10-CM

## 2012-09-05 DIAGNOSIS — L089 Local infection of the skin and subcutaneous tissue, unspecified: Secondary | ICD-10-CM

## 2012-09-05 DIAGNOSIS — N529 Male erectile dysfunction, unspecified: Secondary | ICD-10-CM

## 2012-09-05 DIAGNOSIS — K602 Anal fissure, unspecified: Secondary | ICD-10-CM

## 2012-09-05 MED ORDER — VARDENAFIL HCL 20 MG PO TABS: 20.0000 mg | ORAL_TABLET | Freq: Every day | ORAL | Status: DC | PRN

## 2012-09-05 NOTE — Progress Notes (Signed)
  Subjective:    Patient ID: Adam Glover, male    DOB: Sep 12, 1970, 42 y.o.   MRN: [id number]  HPI Is here for consult concerning multiple issues. He noted some slight discomfort and drainage from his umbilical area yesterday but states today it is causing much less difficulty. He also is had a year-long history of difficulty with bright red blood per rectum. He does not have difficulty with bowel movements but when he wipes he notes blood and some discomfort. He also would like a refill on his Levitra. He does have difficulty with sexual performance and did find this to be useful. He would also like to be STD tested to be safe.  Review of Systems     Objective:   Physical Exam Alert and in no distress. Umbilical exam shows only slight pinkish tint but no erythema tenderness or warmth. No drainage noted. Rectal exam does show a small anal tear but not true fissure and around 5:00.       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 Discussed treatment of the anal fissure and I will call in diltiazem cream. Also instructed him to have soft BMs to help. Told him to avoid anal sexual activity.

## 2012-09-06 ENCOUNTER — Other Ambulatory Visit: Payer: Self-pay

## 2012-09-06 LAB — HIV ANTIBODY (ROUTINE TESTING W REFLEX): HIV: NONREACTIVE

## 2012-09-06 NOTE — Telephone Encounter (Signed)
CALLED L[facility] IN

## 2012-09-06 NOTE — Progress Notes (Signed)
Quick Note:  Mailed pt letter ______

## 2012-09-11 NOTE — Progress Notes (Signed)
Quick Note:  Mailed pt letter of lab results ______ 

## 2012-12-07 ENCOUNTER — Ambulatory Visit (INDEPENDENT_AMBULATORY_CARE_PROVIDER_SITE_OTHER): Payer: Managed Care, Other (non HMO) | Admitting: Family Medicine

## 2012-12-07 VITALS — BP 130/74 | HR 72 | Temp 98.9°F | Resp 16 | Ht 74.5 in | Wt 195.4 lb

## 2012-12-07 DIAGNOSIS — K589 Irritable bowel syndrome without diarrhea: Secondary | ICD-10-CM

## 2012-12-07 DIAGNOSIS — K602 Anal fissure, unspecified: Secondary | ICD-10-CM

## 2012-12-07 DIAGNOSIS — L290 Pruritus ani: Secondary | ICD-10-CM

## 2012-12-07 MED ORDER — LIDOCAINE 5 % EX OINT: TOPICAL_OINTMENT | CUTANEOUS | Status: DC | PRN

## 2012-12-07 MED ORDER — DILTIAZEM GEL 2 %: 1.0000 "application " | Freq: Three times a day (TID) | CUTANEOUS | Status: DC

## 2012-12-07 NOTE — Patient Instructions (Addendum)
Apply the ointments as discussed, especially before bowel movements. Test for yeast/fungus was negative. See other info for pruritus ani as below, but we will also refer you to a gastroenterologist to discuss your symptoms as well to determine if further workup needed. Return to the clinic or go to the nearest emergency room if any of your symptoms worsen or new symptoms occur.  Anal Itching Itching around the anus is a common problem. It is usually not dangerous. It often is caused by skin irritation from stool, moisture, soaps, or clothing. Other causes are pinworms, especially if the itching is worse at night. In adults, the itching may be due to hemorrhoids. In some cases, the cause is unknown. Itching usually can be controlled by keeping the anal area clean and dry. CAUSES   Loose or sticky stool from diarrhea or rectal leakage.   Hemorrhoids. They allow stool to stick to the rectal area.   Certain foods. Be sure to discuss your diet with your caregiver.   Dry skin or skin diseases.   Infections such as a local yeast infection or certain sexually transmitted diseases (STDs).   Worms (parasites).   Diseases of the anus. These include abscesses, fissures, fistulas or cancer.   Sometimes a cause cannot be found.  DIAGNOSIS   Your caregiver will take your history and examine you. A careful exam of the anus is important. Your caregiver will inspect the outer area of your anus and will do a rectal exam.   Sometimes your caregiver will need to look inside the anus. This is a simple procedure that may be a little uncomfortable but usually does not require anesthesia.   If abnormalities are found, a biopsy might be done or you may be referred to a specialist.  TREATMENT  The treatment of your condition will depend on the cause.   Your caregiver will advise you on treatment of any disease found.   If you have rectal leakage or loose stools, a diet high in fiber or a fiber supplement  should improve your condition.   Avoid foods or substances that might be causing your itching.   Gentle care of your anal area is important to avoid worsening the irritation.  HOME CARE INSTRUCTIONS  Do not rub or scratch the area. This makes the itching worse. It could worsen conditions such as parasite infections.   After every bowel movement and at bedtime, gently clean the anal area. Bathe or use moistened tissue or soft wash cloth. You also may use pre-moistened anal cleansing pads or tissues made for cleaning up babies. Do not use soap. Gently pat the area dry.   Wear underwear made of cotton or with a cotton crotch. Do not wear tight fitting clothes or underwear that keep moisture in.   Avoid foods and beverages that may cause anal itching. Examples are beer, tea, coffee, milk, cola, tomatoes, citrus fruits, nuts, chocolate, and spicy foods.   Be sure you have enough fiber in your diet.   Do not use products that may irritate the anal skin. These include perfumed or colored toilet paper, deodorant sprays, and perfumed soaps.   Do not use any medication on the anal area unless advised. Some products may make itching worse.   It may take a few weeks for things to fully improve.  SEEK MEDICAL CARE IF:   The itching is not better in 3 to 4 days or is getting worse.   The skin around the anus becomes red or  tender. This may be a sign of infection.   You have pain in the anus, especially with bowel movement.  SEEK IMMEDIATE MEDICAL CARE IF:  You have increasing pain in the anus or in the abdomen.   You have blood coming from the anus.   You have pus or other discharge from the anus.   You develop a fever.  Document Released: 02/20/2000 Document Revised: 02/11/2011 Document Reviewed: 02/27/2008 [facility] Patient Information 2012 [place], [place].   Anal Fissure, Adult An anal fissure is a small tear or crack in the skin around the anus. Bleeding from a fissure usually stops  on its own within a few minutes. However, bleeding will often reoccur with each bowel movement until the crack heals.  CAUSES   Passing large, hard stools.  Frequent diarrheal stools.  Constipation.  Inflammatory bowel disease (Crohn's disease or ulcerative colitis).  Infections.  Anal sex. SYMPTOMS   Small amounts of blood seen on your stools, on toilet paper, or in the toilet after a bowel movement.  Rectal bleeding.  Painful bowel movements.  Itching or irritation around the anus. DIAGNOSIS Your caregiver will examine the anal area. An anal fissure can usually be seen with careful inspection. A rectal exam may be performed and a short tube (anoscope) may be used to examine the anal canal. TREATMENT   You may be instructed to take fiber supplements. These supplements can soften your stool to help make bowel movements easier.  Sitz baths may be recommended to help heal the tear. Do not use soap in the sitz baths.  A medicated cream or ointment may be prescribed to lessen discomfort. HOME CARE INSTRUCTIONS   Maintain a diet high in fruits, whole grains, and vegetables. Avoid constipating foods like bananas and dairy products.  Take sitz baths as directed by your caregiver.  Drink enough fluids to keep your urine clear or pale yellow.  Only take over-the-counter or prescription medicines for pain, discomfort, or fever as directed by your caregiver. Do not take aspirin as this may increase bleeding.  Do not use ointments containing numbing medications (anesthetics) or hydrocortisone. They could slow healing. SEEK MEDICAL CARE IF:   Your fissure is not completely healed within 3 days.  You have further bleeding.  You have a fever.  You have diarrhea mixed with blood.  You have pain.  Your problem is getting worse rather than better. MAKE SURE YOU:   Understand these instructions.  Will watch your condition.  Will get help right away if you are not doing  well or get worse. Document Released: 02/22/2005 Document Revised: 05/17/2011 Document Reviewed: 08/09/2010 [facility] Patient Information 2014 [place], [place].

## 2012-12-07 NOTE — Progress Notes (Signed)
Subjective:    Patient ID: Adam Glover, male    DOB: 05/21/1970, 42 y.o.   MRN: [id number]  HPI Adam Glover is a 42 y.o. male  Had perianal abscess in March of this year, seen by PCP office - had oral antibiotics, then in follow up was improved. Seen by PCP in July 2014 with anal tear/fissure. Has had bleeding with wiping every time since March of this year, worse this summer when it was hot. More bleeding with wiping then, and had a white/wet appearance with splitting feeling in the anal area. Treated with diltiazem cream 3 times per day - seemed to improve, but splitting came back towards the end of using the cream.  Seen by another doctor at [facility] a month later, treated with hydrocortisone cream for about a week - BID. Didn't feel like it was working - gave up on it.   Still feels slight dull ache in muscle on inside of L leg, persistent BRBPR  with wiping.  NO blood seen on stool. No dark or tarry stools. Possible IBS. Feels like had a substance with wiping in August (showed picture of perianal skin from his phone in August). No recent GI eval. Worried about anal yeast infection as read this online.   Same sex partner, last receptive anal intercourse in July - lubricated condom - same lubricant as in past, no anal intercourse recently.   Review of Systems As above.     Objective:   Physical Exam  Constitutional: He appears well-developed and well-nourished. No distress.  HENT:  Head: Normocephalic and atraumatic.  Pulmonary/Chest: Effort normal.  Abdominal: Soft. There is no tenderness.  Genitourinary: Rectal exam shows fissure (few shallow based fissures/tears - 6 o'clock, 3 o'clock, with slight raw appearing tissure around anus.  no apprarent fistula with rectal exam, and no perirectal abscess appreciated. ). Rectal exam shows no external hemorrhoid. Prostate is not tender.  Skin: Skin is warm and dry. No rash noted.  Psychiatric: He has a normal mood and affect. His  behavior is normal.    Results for orders placed in visit on 12/07/12  POCT SKIN KOH      Result Value Range   Skin KOH, POC Negative        Assessment & Plan:  Adam Glover is a 42 y.o. male Anal fissure - Plan: POCT Skin KOH, diltiazem 2 % GEL, lidocaine (XYLOCAINE) 5 % ointment, Ambulatory referral to Gastroenterology  Pruritus ani - Plan: POCT Skin KOH, Ambulatory referral to Gastroenterology, CANCELED: POCT Wet Prep with KOH  IBS (irritable bowel syndrome) - Plan: Ambulatory referral to Gastroenterology  Suspect combination of pruritus ani, and anal fissure. Start cardizem gel and topical lidocaine as above, and info below on treatment and common triggers of pruritus ani. Did not appreciate perianal abscess on exam, but RTC precautions discussed. Will also refer to gastroenterology given recurrent IBS type symptoms, but now with perianal derm findings, and can discuss if IBD in differential.   Meds ordered this encounter  Medications  . diltiazem 2 % GEL    Sig: Apply 1 application topically 3 (three) times daily. Top tid prn to affected area    Dispense:  30 g    Refill:  0  . lidocaine (XYLOCAINE) 5 % ointment    Sig: Apply topically as needed. Top tid prn to affected area    Dispense:  35.44 g    Refill:  0    Patient Instructions  Apply the ointments  as discussed, especially before bowel movements. Test for yeast/fungus was negative. See other info for pruritus ani as below, but we will also refer you to a gastroenterologist to discuss your symptoms as well to determine if further workup needed. Return to the clinic or go to the nearest emergency room if any of your symptoms worsen or new symptoms occur.  Anal Itching Itching around the anus is a common problem. It is usually not dangerous. It often is caused by skin irritation from stool, moisture, soaps, or clothing. Other causes are pinworms, especially if the itching is worse at night. In adults, the itching may  be due to hemorrhoids. In some cases, the cause is unknown. Itching usually can be controlled by keeping the anal area clean and dry. CAUSES   Loose or sticky stool from diarrhea or rectal leakage.   Hemorrhoids. They allow stool to stick to the rectal area.   Certain foods. Be sure to discuss your diet with your caregiver.   Dry skin or skin diseases.   Infections such as a local yeast infection or certain sexually transmitted diseases (STDs).   Worms (parasites).   Diseases of the anus. These include abscesses, fissures, fistulas or cancer.   Sometimes a cause cannot be found.  DIAGNOSIS   Your caregiver will take your history and examine you. A careful exam of the anus is important. Your caregiver will inspect the outer area of your anus and will do a rectal exam.   Sometimes your caregiver will need to look inside the anus. This is a simple procedure that may be a little uncomfortable but usually does not require anesthesia.   If abnormalities are found, a biopsy might be done or you may be referred to a specialist.  TREATMENT  The treatment of your condition will depend on the cause.   Your caregiver will advise you on treatment of any disease found.   If you have rectal leakage or loose stools, a diet high in fiber or a fiber supplement should improve your condition.   Avoid foods or substances that might be causing your itching.   Gentle care of your anal area is important to avoid worsening the irritation.  HOME CARE INSTRUCTIONS  Do not rub or scratch the area. This makes the itching worse. It could worsen conditions such as parasite infections.   After every bowel movement and at bedtime, gently clean the anal area. Bathe or use moistened tissue or soft wash cloth. You also may use pre-moistened anal cleansing pads or tissues made for cleaning up babies. Do not use soap. Gently pat the area dry.   Wear underwear made of cotton or with a cotton crotch. Do not wear  tight fitting clothes or underwear that keep moisture in.   Avoid foods and beverages that may cause anal itching. Examples are beer, tea, coffee, milk, cola, tomatoes, citrus fruits, nuts, chocolate, and spicy foods.   Be sure you have enough fiber in your diet.   Do not use products that may irritate the anal skin. These include perfumed or colored toilet paper, deodorant sprays, and perfumed soaps.   Do not use any medication on the anal area unless advised. Some products may make itching worse.   It may take a few weeks for things to fully improve.  SEEK MEDICAL CARE IF:   The itching is not better in 3 to 4 days or is getting worse.   The skin around the anus becomes red or tender. This  may be a sign of infection.   You have pain in the anus, especially with bowel movement.  SEEK IMMEDIATE MEDICAL CARE IF:  You have increasing pain in the anus or in the abdomen.   You have blood coming from the anus.   You have pus or other discharge from the anus.   You develop a fever.  Document Released: 02/20/2000 Document Revised: 02/11/2011 Document Reviewed: 02/27/2008 [facility] Patient Information 2012 [place], [place].   Anal Fissure, Adult An anal fissure is a small tear or crack in the skin around the anus. Bleeding from a fissure usually stops on its own within a few minutes. However, bleeding will often reoccur with each bowel movement until the crack heals.  CAUSES   Passing large, hard stools.  Frequent diarrheal stools.  Constipation.  Inflammatory bowel disease (Crohn's disease or ulcerative colitis).  Infections.  Anal sex. SYMPTOMS   Small amounts of blood seen on your stools, on toilet paper, or in the toilet after a bowel movement.  Rectal bleeding.  Painful bowel movements.  Itching or irritation around the anus. DIAGNOSIS Your caregiver will examine the anal area. An anal fissure can usually be seen with careful inspection. A rectal exam may be  performed and a short tube (anoscope) may be used to examine the anal canal. TREATMENT   You may be instructed to take fiber supplements. These supplements can soften your stool to help make bowel movements easier.  Sitz baths may be recommended to help heal the tear. Do not use soap in the sitz baths.  A medicated cream or ointment may be prescribed to lessen discomfort. HOME CARE INSTRUCTIONS   Maintain a diet high in fruits, whole grains, and vegetables. Avoid constipating foods like bananas and dairy products.  Take sitz baths as directed by your caregiver.  Drink enough fluids to keep your urine clear or pale yellow.  Only take over-the-counter or prescription medicines for pain, discomfort, or fever as directed by your caregiver. Do not take aspirin as this may increase bleeding.  Do not use ointments containing numbing medications (anesthetics) or hydrocortisone. They could slow healing. SEEK MEDICAL CARE IF:   Your fissure is not completely healed within 3 days.  You have further bleeding.  You have a fever.  You have diarrhea mixed with blood.  You have pain.  Your problem is getting worse rather than better. MAKE SURE YOU:   Understand these instructions.  Will watch your condition.  Will get help right away if you are not doing well or get worse. Document Released: 02/22/2005 Document Revised: 05/17/2011 Document Reviewed: 08/09/2010 [facility] Patient Information 2014 [place], [place].

## 2012-12-18 ENCOUNTER — Encounter: Payer: Self-pay | Admitting: Internal Medicine

## 2013-01-11 ENCOUNTER — Other Ambulatory Visit: Payer: Self-pay

## 2013-01-17 ENCOUNTER — Encounter: Payer: Self-pay | Admitting: Internal Medicine

## 2013-01-17 ENCOUNTER — Ambulatory Visit (INDEPENDENT_AMBULATORY_CARE_PROVIDER_SITE_OTHER): Payer: Managed Care, Other (non HMO) | Admitting: Internal Medicine

## 2013-01-17 VITALS — BP 130/90 | HR 80 | Ht 74.5 in | Wt 202.4 lb

## 2013-01-17 DIAGNOSIS — K589 Irritable bowel syndrome without diarrhea: Secondary | ICD-10-CM

## 2013-01-17 DIAGNOSIS — K6289 Other specified diseases of anus and rectum: Secondary | ICD-10-CM

## 2013-01-17 NOTE — Progress Notes (Signed)
HISTORY OF PRESENT ILLNESS:  Adam Glover is a 42 y.o. male who presents himself today upon referral from his primary care physician regarding peritoneal discomfort and alternating bowel habits. Patient is gay. He has a history of condyloma acuminata, by his report, requiring therapy. Currently she started back in February was diagnosed with perianal abscess was treated with antibiotics. This improved. Subsequently had problems with perirectal irritation and burning sensation which worsened throughout the course of the day. Occasional blood, though RN on tissue only. Lifetime history of alternating bowel habits with a tendency toward diarrhea. Also flatulence. This time he states he 70% better. On no medical therapy. He showed me a picture via cell phone of the perirectal area. Looks more like irritation erythema. Possibly fungal, though uncertain. GI review of systems is otherwise negative.  REVIEW OF SYSTEMS:  All non-GI ROS negative upon review Past Medical History  Diagnosis Date  . Herpes labialis   . Hypertriglyceridemia   . Seborrheic dermatitis   . Diverticulosis of colon     hx/o diverticulitis x 1  . WPW (Wolff-Parkinson-White syndrome) 1990    ablation  . Vitamin D deficiency   . Tinnitus   . Anal fissure   . IBS (irritable bowel syndrome)   . Hypertension     Past Surgical History  Procedure Laterality Date  . Cardiac electrophysiology mapping and ablation  1990    hx/o WPW  . Rectal condaloma procedure      Social History Adam Glover  reports that he has never smoked. He does not have any smokeless tobacco history on file. He reports that he drinks alcohol. He reports that he does not use illicit drugs.  family history includes Breast cancer in his maternal grandmother and mother; Cancer in his paternal grandmother; Colon polyps in his brother and mother; Dementia in his maternal grandmother; Diabetes in his brother, mother, and paternal grandmother; Heart  disease in his father and maternal grandmother; Heart murmur in his paternal grandmother; Hypertension in his maternal grandmother; Other in his father and maternal grandfather; Pancreatic cancer in his paternal grandfather; Prostate cancer (age of onset: 91) in his father; Seizures in his father; Stroke in his paternal grandmother; Vitamin D deficiency in his brother and father.  Allergies  Allergen Reactions  . Ilosone [Erythromycin Estolate]        PHYSICAL EXAMINATION: Vital signs: BP 130/90  Pulse 80  Ht 6' 2.5" (1.892 m)  Wt 202 lb 6.4 oz (91.808 kg)  BMI 25.65 kg/m2  Constitutional: generally well-appearing, no acute distress Psychiatric: alert and oriented x3, cooperative Eyes: extraocular movements intact, anicteric, conjunctiva pink Mouth: oral pharynx moist, no lesions Neck: supple no lymphadenopathy Cardiovascular: heart regular rate and rhythm, no murmur Lungs: clear to auscultation bilaterally Abdomen: soft, nontender, nondistended, no obvious ascites, no peritoneal signs, normal bowel sounds, no organomegaly Rectal (with Verlon Au): Normal skin. No tenderness. No internal mass or tenderness. Hemoccult negative stool Extremities: no lower extremity edema bilaterally Skin: no lesions on visible extremities Neuro: No focal deficits. No asterixis.    ASSESSMENT:  #1. Previous problems with perirectal irritation either secondary to moisture or possibly fungal. Currently normal #2. Reported history of perirectal abscess treated with antibiotics #3. Chronic bowel issues most consistent with diarrhea predominant IBS   PLAN:  #1. For perirectal skin irritation recommend Desitin cream #2. Screening colonoscopy age 5 #3. GI followup in the interim as needed

## 2013-01-17 NOTE — Patient Instructions (Addendum)
Use Desitin cream on the affected area.  Please call back with any further concerns.

## 2013-03-05 ENCOUNTER — Ambulatory Visit (INDEPENDENT_AMBULATORY_CARE_PROVIDER_SITE_OTHER): Payer: Managed Care, Other (non HMO) | Admitting: Family Medicine

## 2013-03-05 ENCOUNTER — Encounter: Payer: Self-pay | Admitting: Family Medicine

## 2013-03-05 VITALS — BP 120/80 | HR 80 | Wt 208.0 lb

## 2013-03-05 DIAGNOSIS — J340 Abscess, furuncle and carbuncle of nose: Secondary | ICD-10-CM

## 2013-03-05 DIAGNOSIS — J3489 Other specified disorders of nose and nasal sinuses: Secondary | ICD-10-CM

## 2013-03-05 MED ORDER — DOXYCYCLINE HYCLATE 100 MG PO TABS: 100.0000 mg | ORAL_TABLET | Freq: Two times a day (BID) | ORAL | Status: DC

## 2013-03-05 MED ORDER — MUPIROCIN CALCIUM 2 % NA OINT: 1.0000 "application " | TOPICAL_OINTMENT | Freq: Two times a day (BID) | NASAL | Status: DC

## 2013-03-05 NOTE — Progress Notes (Signed)
   Subjective:    Patient ID: Adam Glover, male    DOB: 27-Dec-1970, 42 y.o.   MRN: [id number]  HPI The last month he is noted nasal crusting and pain on both sides of the nose anteriorly on the superior aspect of the nose.   Review of Systems     Objective:   Physical Exam Alert and in no distress. Exam of the nares bilaterally does show some erythema and slight crusting in the superior aspect of the nose.       Assessment & Plan:  Nasal abscess - Plan: mupirocin nasal ointment (BACTROBAN NASAL) 2 %, doxycycline (VIBRA-TABS) 100 MG tablet  recommend he use the nasal and on a regular basis as well as a doxycycline. He will let me know if this is not curative.

## 2014-04-29 ENCOUNTER — Telehealth: Payer: Self-pay | Admitting: Family Medicine

## 2014-04-29 MED ORDER — VALACYCLOVIR HCL 1 G PO TABS: ORAL_TABLET | ORAL | Status: DC

## 2014-04-29 NOTE — Telephone Encounter (Signed)
Pt has an outbreak on his lip and it is getting big. Requesting refill of Valtrex 1000MG  ASAP or a suggestion of something he can get OTC to help

## 2014-08-18 ENCOUNTER — Ambulatory Visit (HOSPITAL_COMMUNITY)
Admission: RE | Admit: 2014-08-18 | Discharge: 2014-08-18 | Disposition: A | Discharge status: Home / Self Care | Payer: Managed Care, Other (non HMO) | Source: Ambulatory Visit | Attending: Family Medicine | Admitting: Family Medicine

## 2014-08-18 ENCOUNTER — Telehealth: Payer: Self-pay | Admitting: Family Medicine

## 2014-08-18 ENCOUNTER — Ambulatory Visit (INDEPENDENT_AMBULATORY_CARE_PROVIDER_SITE_OTHER): Payer: Managed Care, Other (non HMO) | Admitting: Family Medicine

## 2014-08-18 ENCOUNTER — Encounter (HOSPITAL_COMMUNITY): Payer: Self-pay

## 2014-08-18 VITALS — BP 122/74 | HR 103 | Temp 100.0°F | Resp 17 | Ht 75.0 in | Wt 202.0 lb

## 2014-08-18 DIAGNOSIS — R1032 Left lower quadrant pain: Secondary | ICD-10-CM | POA: Diagnosis not present

## 2014-08-18 DIAGNOSIS — K5732 Diverticulitis of large intestine without perforation or abscess without bleeding: Secondary | ICD-10-CM | POA: Insufficient documentation

## 2014-08-18 DIAGNOSIS — K769 Liver disease, unspecified: Secondary | ICD-10-CM | POA: Insufficient documentation

## 2014-08-18 DIAGNOSIS — R509 Fever, unspecified: Secondary | ICD-10-CM

## 2014-08-18 LAB — POCT CBC
GRANULOCYTE PERCENT: 74.2 % (ref 37–80)
HEMATOCRIT: 45.2 % (ref 43.5–53.7)
HEMOGLOBIN: 15.5 g/dL (ref 14.1–18.1)
Lymph, poc: 2.7 (ref 0.6–3.4)
MCH: 33.3 pg — AB (ref 27–31.2)
MCHC: 34.4 g/dL (ref 31.8–35.4)
MCV: 96.9 fL (ref 80–97)
MID (CBC): 0.7 (ref 0–0.9)
MPV: 8.1 fL (ref 0–99.8)
POC GRANULOCYTE: 9.9 — AB (ref 2–6.9)
POC LYMPH PERCENT: 20.3 %L (ref 10–50)
POC MID %: 5.5 % (ref 0–12)
Platelet Count, POC: 197 10*3/uL (ref 142–424)
RBC: 4.67 M/uL — AB (ref 4.69–6.13)
RDW, POC: 11.9 %
WBC: 13.3 10*3/uL — AB (ref 4.6–10.2)

## 2014-08-18 MED ORDER — METRONIDAZOLE 500 MG PO TABS: ORAL_TABLET | ORAL | Status: DC

## 2014-08-18 MED ORDER — CIPROFLOXACIN HCL 500 MG PO TABS: 500.0000 mg | ORAL_TABLET | Freq: Two times a day (BID) | ORAL | Status: DC

## 2014-08-18 MED ORDER — IOHEXOL 300 MG/ML  SOLN
50.0000 mL | Freq: Once | INTRAMUSCULAR | Status: AC | PRN
  Administered 2014-08-18: 50 mL via ORAL

## 2014-08-18 MED ORDER — IOHEXOL 300 MG/ML  SOLN
100.0000 mL | Freq: Once | INTRAMUSCULAR | Status: AC | PRN
  Administered 2014-08-18: 100 mL via INTRAVENOUS

## 2014-08-18 NOTE — Progress Notes (Signed)
Call and have him follow-up with me

## 2014-08-18 NOTE — Telephone Encounter (Signed)
Patient was seen today(08/18/14) by Dr. Neva Seat. Patient is requesting for a work note. Please call patient at [phone].

## 2014-08-18 NOTE — Progress Notes (Addendum)
Subjective:    Patient ID: Adam Glover, male    DOB: 1970-11-23, 44 y.o.   MRN: [id number] This chart was scribed for Adam Staggers, MD by Littie Deeds, Medical Scribe. This patient was seen in Room 12 and the patient's care was started at 9:14 AM.   HPI HPI Comments: LAMONE Glover is a 44 y.o. male with a hx of diverticulosis, IBS (diarrhea pre-dominant), and perirectal abscess who presents to the [facility] complaining of gradual onset abdominal pain that started after he ate lunch 6 days ago.  Per review of chart, he was seen by Dr. Marina Goodell in November, 2014. Primary care provider is Carollee Herter, MD.   Abdominal Pain: The pain started in his central abdomen 6 days ago, but radiated to his RLQ and LLQ the following day (worse in the LLQ). The pain is described as a "general malaise in his gut." It has been gradually improving, but still persists. He had a normal bowel movement soon after eating lunch 6 days ago, but about 6 hours later, he had a light bowel movement in which he could see the seeds of the okra he ate. The bowel movements did not relieve the pain. Since then, he has had relatively normal bowel movements - slightly loose stools, but this is not unusual for him. He thought the pain may have been related to his kidneys, so he has been drinking cranberry juice. Patient denies fever, chills, nausea, vomiting, diarrhea, blood in stool, dysuria, urinary frequency, and urinary urgency. He has been eating and drinking fine, and he has been drinking plenty of fluids. He had some abdominal pain due to an episode of food poisoning last year, but the pain did not last as long as his current pain. He has not had a colonoscopy.  Patient Active Problem List   Diagnosis Date Noted  . WPW syndrome 04/05/2012  . General medical examination 03/03/2011  . Hypertriglyceridemia 03/03/2011   Past Medical History  Diagnosis Date  . Herpes labialis   .  Hypertriglyceridemia   . Seborrheic dermatitis   . Diverticulosis of colon     hx/o diverticulitis x 1  . WPW (Wolff-Parkinson-White syndrome) 1990    ablation  . Vitamin D deficiency   . Tinnitus   . Anal fissure   . IBS (irritable bowel syndrome)   . Hypertension    Past Surgical History  Procedure Laterality Date  . Cardiac electrophysiology mapping and ablation  1990    hx/o WPW  . Rectal condaloma procedure     Allergies  Allergen Reactions  . Ilosone [Erythromycin Estolate]    Prior to Admission medications   Medication Sig Start Date End Date Taking? Authorizing Provider  valACYclovir (VALTREX) 1000 MG tablet 2 pills twice a day for one day Patient not taking: Reported on 08/18/2014 04/29/14   Ronnald Nian, MD  vardenafil (LEVITRA) 20 MG tablet Take 1 tablet (20 mg total) by mouth daily as needed for erectile dysfunction. Patient not taking: Reported on 08/18/2014 09/05/12   Ronnald Nian, MD   History   Social History  . Marital Status: Single    Spouse Name: N/A  . Number of Children: N/A  . Years of Education: N/A   Occupational History  . Not on file.   Social History Main Topics  . Smoking status: Never Smoker   . Smokeless tobacco: Not on file  . Alcohol Use: 0.0 oz/week     Comment: 1-2 drinks  daily  . Drug Use: No  . Sexual Activity: Yes    Birth Control/ Protection: Condom   Other Topics Concern  . Not on file   Social History Narrative   In relationship with another male x 1+ year, but hx/o multiple sexual partners, not exercising     Review of Systems  Constitutional: Negative for fever, chills and diaphoresis.  Gastrointestinal: Positive for abdominal pain. Negative for nausea, vomiting, diarrhea and blood in stool.  Genitourinary: Negative for dysuria, urgency and frequency.       Objective:   Physical Exam  Constitutional: He is oriented to person, place, and time. He appears well-developed and well-nourished. No distress.  HENT:    Head: Normocephalic and atraumatic.  Mouth/Throat: Oropharynx is clear and moist. No oropharyngeal exudate.  Eyes: Pupils are equal, round, and reactive to light.  Neck: Neck supple.  Cardiovascular: Regular rhythm.  Tachycardia present.   No murmur heard. Pulmonary/Chest: Effort normal.  Abdominal: Bowel sounds are normal. He exhibits no distension. There is tenderness in the left lower quadrant.  Negative heel jar.  Musculoskeletal: He exhibits no edema.  Neurological: He is alert and oriented to person, place, and time. No cranial nerve deficit.  Skin: Skin is warm and dry. No rash noted.  Psychiatric: He has a normal mood and affect. His behavior is normal.  Vitals reviewed.   Filed Vitals:   08/18/14 0852  BP: 122/74  Pulse: 103  Temp: 100 F (37.8 C)  TempSrc: Oral  Resp: 17  Height:  (1.905 m)  Weight: 202 lb (91.627 kg)  SpO2: 98%     IMPRESSION: 1. Acute descending colonic diverticulitis with surrounding inflammation and possible contained microperforation. No drainable abscess, extravasated contrast or free intraperitoneal air. 2. The appendix appears normal. 3. Scattered low-density hepatic lesions, likely cysts.    Assessment & Plan:   Adam Glover is a 44 y.o. male LLQ abdominal pain - Plan: POCT CBC, ciprofloxacin (CIPRO) 500 MG tablet, metroNIDAZOLE (FLAGYL) 500 MG tablet, CT Abdomen Pelvis W Contrast  Fever, unspecified fever cause - Plan: POCT CBC, CT Abdomen Pelvis W Contrast  Diverticulitis of colon - Plan: ciprofloxacin (CIPRO) 500 MG tablet, metroNIDAZOLE (FLAGYL) 500 MG tablet, CT Abdomen Pelvis W Contrast  Diverticulitis by history, with reported symptomatic improvement past few days. CT abd pelvis obtained as above, possible microperf, but no abscess.  -Will initially try outpatient treatment with Cipro  BID and Flagyl  BID.   -clear fluids for diet for now. Then slowly advance as sx's improve.   -out of work until follow  up in 48 hrs.   -repeat exam and possible CBC in 48 hours - with Dr. Clelia Croft Tuesday morning or Dr. Susann Givens.    -IF any worsening overnight - to ER.   -Plan on follow up with GI in next 6 weeks for possible colonoscopy after current flair resolved.   -ER/RTC precautions and plan discussed on phone with patient. Understanding expressed.      Meds ordered this encounter  Medications  . ciprofloxacin (CIPRO) 500 MG tablet    Sig: Take 1 tablet (500 mg total) by mouth 2 (two) times daily.    Dispense:  20 tablet    Refill:  0  . metroNIDAZOLE (FLAGYL) 500 MG tablet    Sig: 1 pill by mouth twice per day.  Avoid any alcohol while taking this medicine.    Dispense:  20 tablet    Refill:  0   Patient Instructions  Please go to [facility] for CT abdomen/pelvis with contrast NOW Go to MAIN entrance and register as an OUTPATIENT Call report [phone] for Dr Neva Seat    I suspect you have diverticulitis, but will check CT scan to make sure no abscess or perforation. I sent in Cipro and Flagyl (antibitoics) to start once you have had the CT scan but will call you with results once I have them.  Plan on follow up here or with your primary care provider in the next 2-3 days to make sure you are improving, then plan on follow up with gastroenterologist (Dr.Perry) in next 6 weeks.  Return to the clinic or go to the nearest emergency room if any of your symptoms worsen or new symptoms occur.   Abdominal Pain Many things can cause abdominal pain. Usually, abdominal pain is not caused by a disease and will improve without treatment. It can often be observed and treated at home. Your health care provider will do a physical exam and possibly order blood tests and X-rays to help determine the seriousness of your pain. However, in many cases, more time must pass before a clear cause of the pain can be found. Before that point, your health care provider may not know if you need more testing or further  treatment. HOME CARE INSTRUCTIONS  Monitor your abdominal pain for any changes. The following actions may help to alleviate any discomfort you are experiencing:  Only take over-the-counter or prescription medicines as directed by your health care provider.  Do not take laxatives unless directed to do so by your health care provider.  Try a clear liquid diet (broth, tea, or water) as directed by your health care provider. Slowly move to a bland diet as tolerated. SEEK MEDICAL CARE IF:  You have unexplained abdominal pain.  You have abdominal pain associated with nausea or diarrhea.  You have pain when you urinate or have a bowel movement.  You experience abdominal pain that wakes you in the night.  You have abdominal pain that is worsened or improved by eating food.  You have abdominal pain that is worsened with eating fatty foods.  You have a fever. SEEK IMMEDIATE MEDICAL CARE IF:   Your pain does not go away within 2 hours.  You keep throwing up (vomiting).  Your pain is felt only in portions of the abdomen, such as the right side or the left lower portion of the abdomen.  You pass bloody or black tarry stools. MAKE SURE YOU:  Understand these instructions.   Will watch your condition.   Will get help right away if you are not doing well or get worse.  Document Released: 12/02/2004 Document Revised: 02/27/2013 Document Reviewed: 11/01/2012 [facility] Patient Information 2015 [place], [place]. This information is not intended to replace advice given to you by your health care provider. Make sure you discuss any questions you have with your health care provider.     I personally performed the services described in this documentation, which was scribed in my presence. The recorded information has been reviewed and considered, and addended by me as needed.

## 2014-08-18 NOTE — Patient Instructions (Addendum)
Please go to [facility] for CT abdomen/pelvis with contrast NOW Go to MAIN entrance and register as an OUTPATIENT Call report [phone] for Dr Neva Seat    I suspect you have diverticulitis, but will check CT scan to make sure no abscess or perforation. I sent in Cipro and Flagyl (antibitoics) to start once you have had the CT scan but will call you with results once I have them.  Plan on follow up here or with your primary care provider in the next 2-3 days to make sure you are improving, then plan on follow up with gastroenterologist (Dr.Perry) in next 6 weeks.  Return to the clinic or go to the nearest emergency room if any of your symptoms worsen or new symptoms occur.   Abdominal Pain Many things can cause abdominal pain. Usually, abdominal pain is not caused by a disease and will improve without treatment. It can often be observed and treated at home. Your health care provider will do a physical exam and possibly order blood tests and X-rays to help determine the seriousness of your pain. However, in many cases, more time must pass before a clear cause of the pain can be found. Before that point, your health care provider may not know if you need more testing or further treatment. HOME CARE INSTRUCTIONS  Monitor your abdominal pain for any changes. The following actions may help to alleviate any discomfort you are experiencing:  Only take over-the-counter or prescription medicines as directed by your health care provider.  Do not take laxatives unless directed to do so by your health care provider.  Try a clear liquid diet (broth, tea, or water) as directed by your health care provider. Slowly move to a bland diet as tolerated. SEEK MEDICAL CARE IF:  You have unexplained abdominal pain.  You have abdominal pain associated with nausea or diarrhea.  You have pain when you urinate or have a bowel movement.  You experience abdominal pain that wakes you in the night.  You  have abdominal pain that is worsened or improved by eating food.  You have abdominal pain that is worsened with eating fatty foods.  You have a fever. SEEK IMMEDIATE MEDICAL CARE IF:   Your pain does not go away within 2 hours.  You keep throwing up (vomiting).  Your pain is felt only in portions of the abdomen, such as the right side or the left lower portion of the abdomen.  You pass bloody or black tarry stools. MAKE SURE YOU:  Understand these instructions.   Will watch your condition.   Will get help right away if you are not doing well or get worse.  Document Released: 12/02/2004 Document Revised: 02/27/2013 Document Reviewed: 11/01/2012 [facility] Patient Information 2015 [place], [place]. This information is not intended to replace advice given to you by your health care provider. Make sure you discuss any questions you have with your health care provider.

## 2014-08-19 NOTE — Telephone Encounter (Signed)
Note ready to pick up. Pt notified.

## 2014-08-20 ENCOUNTER — Encounter: Payer: Self-pay | Admitting: Family Medicine

## 2014-08-20 ENCOUNTER — Ambulatory Visit (INDEPENDENT_AMBULATORY_CARE_PROVIDER_SITE_OTHER): Payer: Managed Care, Other (non HMO) | Admitting: Family Medicine

## 2014-08-20 VITALS — BP 120/90 | HR 88 | Temp 97.7°F | Wt 199.0 lb

## 2014-08-20 DIAGNOSIS — K5732 Diverticulitis of large intestine without perforation or abscess without bleeding: Secondary | ICD-10-CM | POA: Diagnosis not present

## 2014-08-20 NOTE — Progress Notes (Signed)
   Subjective:    Patient ID: Adam Glover, male    DOB: 05-25-1970, 44 y.o.   MRN: [id number]  HPI He is here for a follow-up visit. He was diagnosed several days ago with diverticulitis and placed on Flagyl and Cipro. Review of the CT scan indicates possible abscess versus tear. He states that he started to feel better the next day and in the day is even better. He is at least 50% better than when he started. No fever, chills, abdominal pain, nausea or vomiting.   Review of Systems     Objective:   Physical Exam Alert and in no distress. Abdominal exam shows no masses or tenderness with normal bowel sounds.       Assessment & Plan:  Diverticulitis of colon 's cussed the diagnosis of diverticulitis with him. Encouraged him to continue to take the anabiotic until it is gone. No restrictions on his eating habits. Discussed reoccurrence of the symptoms and appropriate follow-up.

## 2015-02-05 ENCOUNTER — Emergency Department (HOSPITAL_COMMUNITY): Payer: Managed Care, Other (non HMO)

## 2015-02-05 ENCOUNTER — Emergency Department (HOSPITAL_COMMUNITY)
Admission: EM | Admit: 2015-02-05 | Discharge: 2015-02-06 | Disposition: A | Discharge status: Home / Self Care | Payer: Managed Care, Other (non HMO) | Attending: Emergency Medicine | Admitting: Emergency Medicine

## 2015-02-05 ENCOUNTER — Encounter (HOSPITAL_COMMUNITY): Payer: Self-pay

## 2015-02-05 DIAGNOSIS — Z872 Personal history of diseases of the skin and subcutaneous tissue: Secondary | ICD-10-CM | POA: Diagnosis not present

## 2015-02-05 DIAGNOSIS — R0602 Shortness of breath: Secondary | ICD-10-CM | POA: Diagnosis not present

## 2015-02-05 DIAGNOSIS — Z8639 Personal history of other endocrine, nutritional and metabolic disease: Secondary | ICD-10-CM | POA: Diagnosis not present

## 2015-02-05 DIAGNOSIS — Z79899 Other long term (current) drug therapy: Secondary | ICD-10-CM | POA: Diagnosis not present

## 2015-02-05 DIAGNOSIS — Z8619 Personal history of other infectious and parasitic diseases: Secondary | ICD-10-CM | POA: Diagnosis not present

## 2015-02-05 DIAGNOSIS — Z8719 Personal history of other diseases of the digestive system: Secondary | ICD-10-CM | POA: Diagnosis not present

## 2015-02-05 DIAGNOSIS — R000 Tachycardia, unspecified: Secondary | ICD-10-CM | POA: Diagnosis not present

## 2015-02-05 DIAGNOSIS — I447 Left bundle-branch block, unspecified: Secondary | ICD-10-CM | POA: Diagnosis not present

## 2015-02-05 DIAGNOSIS — R002 Palpitations: Secondary | ICD-10-CM | POA: Diagnosis not present

## 2015-02-05 DIAGNOSIS — R42 Dizziness and giddiness: Secondary | ICD-10-CM | POA: Insufficient documentation

## 2015-02-05 DIAGNOSIS — I10 Essential (primary) hypertension: Secondary | ICD-10-CM | POA: Diagnosis not present

## 2015-02-05 DIAGNOSIS — Z7982 Long term (current) use of aspirin: Secondary | ICD-10-CM | POA: Insufficient documentation

## 2015-02-05 LAB — PROTIME-INR
INR: 0.94 (ref 0.00–1.49)
Prothrombin Time: 12.8 seconds (ref 11.6–15.2)

## 2015-02-05 LAB — COMPREHENSIVE METABOLIC PANEL
ALT: 30 U/L (ref 17–63)
AST: 25 U/L (ref 15–41)
Albumin: 4.1 g/dL (ref 3.5–5.0)
Alkaline Phosphatase: 109 U/L (ref 38–126)
Anion gap: 4 — ABNORMAL LOW (ref 5–15)
BUN: 19 mg/dL (ref 6–20)
CHLORIDE: 103 mmol/L (ref 101–111)
CO2: 29 mmol/L (ref 22–32)
CREATININE: 1.14 mg/dL (ref 0.61–1.24)
Calcium: 9 mg/dL (ref 8.9–10.3)
Glucose, Bld: 140 mg/dL — ABNORMAL HIGH (ref 65–99)
POTASSIUM: 3.8 mmol/L (ref 3.5–5.1)
Sodium: 136 mmol/L (ref 135–145)
Total Bilirubin: 0.8 mg/dL (ref 0.3–1.2)
Total Protein: 7 g/dL (ref 6.5–8.1)

## 2015-02-05 LAB — CBC
HCT: 40.9 % (ref 39.0–52.0)
Hemoglobin: 14.6 g/dL (ref 13.0–17.0)
MCH: 34.1 pg — AB (ref 26.0–34.0)
MCHC: 35.7 g/dL (ref 30.0–36.0)
MCV: 95.6 fL (ref 78.0–100.0)
PLATELETS: 153 10*3/uL (ref 150–400)
RBC: 4.28 MIL/uL (ref 4.22–5.81)
RDW: 11.9 % (ref 11.5–15.5)
WBC: 5.9 10*3/uL (ref 4.0–10.5)

## 2015-02-05 LAB — D-DIMER, QUANTITATIVE (NOT AT ARMC)

## 2015-02-05 LAB — I-STAT TROPONIN, ED: TROPONIN I, POC: 0 ng/mL (ref 0.00–0.08)

## 2015-02-05 LAB — APTT: aPTT: 29 seconds (ref 24–37)

## 2015-02-05 MED ORDER — NITROGLYCERIN 0.4 MG SL SUBL: 0.4000 mg | SUBLINGUAL_TABLET | SUBLINGUAL | Status: DC | PRN

## 2015-02-05 MED ORDER — LORAZEPAM 2 MG/ML IJ SOLN
1.0000 mg | Freq: Once | INTRAMUSCULAR | Status: AC
  Administered 2015-02-05: 1 mg via INTRAVENOUS
  Filled 2015-02-05: qty 1

## 2015-02-05 MED ORDER — ASPIRIN 81 MG PO CHEW: 324.0000 mg | CHEWABLE_TABLET | Freq: Once | ORAL | Status: DC

## 2015-02-05 MED ORDER — SODIUM CHLORIDE 0.9 % IV SOLN
INTRAVENOUS | Status: DC
  Administered 2015-02-05: 23:00:00 via INTRAVENOUS

## 2015-02-05 MED ORDER — SODIUM CHLORIDE 0.9 % IV BOLUS (SEPSIS)
1000.0000 mL | Freq: Once | INTRAVENOUS | Status: AC
  Administered 2015-02-05: 1000 mL via INTRAVENOUS

## 2015-02-05 NOTE — ED Notes (Signed)
Bed: WA10 Expected date:  Expected time:  Means of arrival:  Comments: Triage 1   

## 2015-02-05 NOTE — ED Notes (Addendum)
PT C/O PALPITATIONS X2 WEEKS, BUT WORSE TONIGHT. PT ALSO C/O DIZZINESS AND MID-STERNAL CHEST PAIN TONIGHT. PT STATES HE HAS A HX OF WPW SYNDROME. NO CHEST PAIN AT PRESENT.

## 2015-02-05 NOTE — ED Provider Notes (Signed)
CSN: [id number]     Arrival date & time 02/05/15  2114 History   First MD Initiated Contact with Patient 02/05/15 2136     Chief Complaint  Patient presents with  . Palpitations    x2 WEEKS  . Dizziness     (Consider location/radiation/quality/duration/timing/severity/associated sxs/prior Treatment) Patient is a 44 y.o. male presenting with palpitations and dizziness.  Palpitations Palpitations quality:  Regular Onset quality:  Gradual Duration:  2 hours Timing:  Constant Progression:  Worsening Chronicity:  Recurrent Context: anxiety and caffeine   Context: not blood loss, not dehydration, not exercise and not hyperventilation   Relieved by:  None tried Worsened by:  Nothing Ineffective treatments:  None tried Associated symptoms: dizziness and shortness of breath   Associated symptoms: no back pain, no chest pain, no lower extremity edema and no nausea   Dizziness Associated symptoms: palpitations and shortness of breath   Associated symptoms: no chest pain and no nausea     Past Medical History  Diagnosis Date  . Herpes labialis   . Hypertriglyceridemia   . Seborrheic dermatitis   . Diverticulosis of colon     hx/o diverticulitis x 1  . WPW (Wolff-Parkinson-White syndrome) 1990    ablation  . Vitamin D deficiency   . Tinnitus   . Anal fissure   . IBS (irritable bowel syndrome)   . Hypertension    Past Surgical History  Procedure Laterality Date  . Cardiac electrophysiology mapping and ablation  1990    hx/o WPW  . Rectal condaloma procedure     Family History  Problem Relation Age of Onset  . Breast cancer Maternal Grandmother   . Hypertension Maternal Grandmother   . Dementia Maternal Grandmother   . Cancer Paternal Grandmother   . Diabetes Paternal Grandmother   . Stroke Paternal Grandmother   . Pancreatic cancer Paternal Grandfather     questionable  . Breast cancer Mother   . Diabetes Mother   . Prostate cancer Father 59  . Seizures Father    . Heart disease Father     BBB  . Vitamin D deficiency Father   . Vitamin D deficiency Brother   . Diabetes Brother   . Colon polyps Mother   . Colon polyps Brother   . Heart disease Maternal Grandmother   . Heart murmur Paternal Grandmother   . Other Father     bundle branch block  . Other Maternal Grandfather     mucoid cancer   Social History  Substance Use Topics  . Smoking status: Never Smoker   . Smokeless tobacco: None  . Alcohol Use: 0.0 oz/week     Comment: 1-2 drinks daily    Review of Systems  Constitutional: Negative for fever and chills.  Eyes: Negative for photophobia and pain.  Respiratory: Positive for shortness of breath.   Cardiovascular: Positive for palpitations. Negative for chest pain.  Gastrointestinal: Negative for nausea.  Musculoskeletal: Negative for back pain.  Skin: Negative for pallor and wound.  Neurological: Positive for dizziness.  All other systems reviewed and are negative.     Allergies  Ilosone  Home Medications   Prior to Admission medications   Medication Sig Start Date End Date Taking? Authorizing Provider  aspirin 325 MG tablet Take 325 mg by mouth daily.   Yes Historical Provider, MD  POTASSIUM GLUCONATE PO Take 1 tablet by mouth daily.   Yes Historical Provider, MD  ciprofloxacin (CIPRO) 500 MG tablet Take 1 tablet (500 mg total) by  mouth 2 (two) times daily. Patient not taking: Reported on 02/05/2015 08/18/14   Shade FloodJeffrey R Greene, MD  metroNIDAZOLE (FLAGYL) 500 MG tablet 1 pill by mouth twice per day.  Avoid any alcohol while taking this medicine. Patient not taking: Reported on 02/05/2015 08/18/14   Shade FloodJeffrey R Greene, MD   BP 179/103 mmHg  Pulse 109  Temp(Src) 98.1 F (36.7 C) (Oral)  Resp 14  Ht 6\' 3"  (1.905 m)  Wt 210 lb (95.255 kg)  BMI 26.25 kg/m2  SpO2 100% Physical Exam  Constitutional: He is oriented to person, place, and time. He appears well-developed and well-nourished.  HENT:  Head: Normocephalic and  atraumatic.  Eyes: Conjunctivae are normal. Pupils are equal, round, and reactive to light.  Neck: Normal range of motion.  Cardiovascular: Tachycardia present.   Pulmonary/Chest: Effort normal. No respiratory distress. He has no wheezes. He has no rales.  Abdominal: Soft. He exhibits no distension. There is no tenderness.  Musculoskeletal: Normal range of motion. He exhibits no edema or tenderness.  Neurological: He is alert and oriented to person, place, and time.  Nursing note and vitals reviewed.   ED Course  Procedures (including critical care time) Labs Review Labs Reviewed  CBC - Abnormal; Notable for the following:    MCH 34.1 (*)    All other components within normal limits  COMPREHENSIVE METABOLIC PANEL - Abnormal; Notable for the following:    Glucose, Bld 140 (*)    Anion gap 4 (*)    All other components within normal limits  APTT  PROTIME-INR  D-DIMER, QUANTITATIVE (NOT AT [facility])  Rosezena SensorI-STAT TROPOININ, ED    Imaging Review Dg Chest 2 View  02/05/2015  CLINICAL DATA:  44 year old male with palpitations x2 weeks. Midsternal chest pain. EXAM: CHEST  2 VIEW COMPARISON:  None. FINDINGS: Two views of the chest demonstrate clear lungs with sharp costophrenic angles. There is mild eventration of the left hemidiaphragm. The cardiac silhouette is within normal limits. The osseous structures are grossly unremarkable. IMPRESSION: No active cardiopulmonary disease. Electronically Signed   By: Elgie CollardArash  Radparvar M.D.   On: 02/05/2015 21:55   I have personally reviewed and evaluated these images and lab results as part of my medical decision-making.   EKG Interpretation   Date/Time:  Wednesday February 05 2015 21:22:32 EST Ventricular Rate:  102 PR Interval:  108 QRS Duration: 136 QT Interval:  444 QTC Calculation: 578 R Axis:   17 Text Interpretation:  Sinus tachycardia Left bundle branch block Confirmed  by Maryagnes Carrasco MD, Barbara CowerJASON [phone]([id number]) on 02/05/2015 9:34:20 PM      MDM    Final diagnoses:  Palpitations  Left bundle branch block   44 year old male who presented to the emergency department today with anxiety, palpitations, dizziness, dyspnea. Similar to previous episodes. 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 Had been being followed by cardiology up until 2014. This episode of and on for couple hours prior to arrival. Has some chest discomfort is mostly secondary to palpitations. EKG with a left bundle branch block but not meeting sgarbossa criteria. D dimer and initial troponin negative. Low risk for ACS at this time, however will get 2nd troponin. Has follow up with pcp tomorrow who can help to order a stress echocardiogram. Will also refer him to cardiology for further workup of his palpitations.     Marily MemosJason Domanick Cuccia, MD 02/07/15 [phone]

## 2015-02-06 ENCOUNTER — Encounter: Payer: Self-pay | Admitting: Medical

## 2015-02-06 ENCOUNTER — Ambulatory Visit (INDEPENDENT_AMBULATORY_CARE_PROVIDER_SITE_OTHER): Payer: Managed Care, Other (non HMO) | Admitting: Medical

## 2015-02-06 VITALS — BP 140/100 | HR 86 | Resp 16 | Wt 202.0 lb

## 2015-02-06 DIAGNOSIS — I456 Pre-excitation syndrome: Secondary | ICD-10-CM | POA: Diagnosis not present

## 2015-02-06 DIAGNOSIS — R42 Dizziness and giddiness: Secondary | ICD-10-CM

## 2015-02-06 DIAGNOSIS — I447 Left bundle-branch block, unspecified: Secondary | ICD-10-CM | POA: Diagnosis not present

## 2015-02-06 DIAGNOSIS — I10 Essential (primary) hypertension: Secondary | ICD-10-CM

## 2015-02-06 DIAGNOSIS — E559 Vitamin D deficiency, unspecified: Secondary | ICD-10-CM | POA: Diagnosis not present

## 2015-02-06 DIAGNOSIS — R002 Palpitations: Secondary | ICD-10-CM

## 2015-02-06 LAB — TROPONIN I

## 2015-02-06 NOTE — ED Notes (Signed)
Pt given discharge instructions, verbalized understanding of need to follow up with PCP and cardiology, reasons to return to the ED, and medications to continue at home. Pt denies pain, questions, or further concerns. Pt able to change clothes and ambulate to BR and to exit, moving all extremities well.

## 2015-02-06 NOTE — Progress Notes (Signed)
Subjective: Chief Complaint  Patient presents with  . high bp?    said he thought it was from his wpw disorder. said he felt that his heart stopped went flush, and then he said it came right back. said that he started feeling better. started have fluttering and light headedness a week ago took asprin and drunk water. said it is worrying him since it is coming and going. said that he eats a lot of fried food, drinks lots of soda, social drinker, does not exercise. [facility] in epic, said that he has bundle branch block in left ventricle    Here for several episodes of dizziness, palpitations, elevated blood pressures.  [facility], had EKG, labs, was advised to f/u here today and see cardiology soon.    He has hx/o WPW that was somewhat unsuccessfully treated in remote past. He saw cardiology 2014 for f/u on similar issues.  He has been in usual state of health without problems until 2 weeks ago.   He notes having several recent episodes of dizziness, palpitations, sometimes weakness, occasional sharp brief pain that he describes as like fireworks exploding.   Denies wheezing, SOB, nausea, sweats, edema.   These episodes can be brief lasting seconds but can be recurrent.  The episode yesterday was so bad he dropped to his knees, family called EMS.  One episode he felt pale, but color came back.  He was also concerned as BP has been up this past week as high as 157/103.     Past Medical History  Diagnosis Date  . Herpes labialis   . Hypertriglyceridemia   . Seborrheic dermatitis   . Diverticulosis of colon     hx/o diverticulitis x 1  . WPW (Wolff-Parkinson-White syndrome) 1990    ablation  . Vitamin D deficiency   . Tinnitus   . Anal fissure   . IBS (irritable bowel syndrome)   . Hypertension    ROS as in subjective   Objective BP 140/100 mmHg  Pulse 86  Resp 16  Wt 202 lb (91.627 kg)  SpO2 98%  BP Readings from Last 3 Encounters:   02/06/15 140/100  02/06/15 136/94  08/20/14 120/90   My BP reading was 140/100 as well.   General appearance: alert, no distress, WD/WN, white male Oral MMM, no lesions Neck: supple, no lymphadenopathy, no thyromegaly, no masses, no JVD Heart: RRR, normal S1, S2, no murmurs Lungs: CTA bilaterally, no wheezes, rhonchi, or rales Pulses: 2+ symmetric, upper and lower extremities, normal cap refill Ext: no edema Neuro: cn2-12 intact, nonfocal exam    Assessment: Encounter Diagnoses  Name Primary?  . Dizziness and giddiness Yes  . Essential hypertension   . Palpitations   . Vitamin D deficiency   . WPW (Wolff-Parkinson-White syndrome)   . Left bundle branch block     Plan: Discussed his concerns, reviewed ED report, EKG, CXR, labs from last night's visit.  The 2nd set of troponin's came back ok.  Reviewed 2014 cardiology eval.   He has had some mild episodes of palpation since leaving the ED.  After reviewing records, we called cardiology where he was seen in 2014 and they will work him in tomorrow for visit to discuss treatment and next steps.   Advised for today to rest, no strenuous activity, avoid salt, caffeine, alcohol, GERD trigger foods and plan to have f/u tomorrow with cardiology.  If worse or severe symptoms in  the meantime, call the after hours line or call EMS.

## 2015-02-07 ENCOUNTER — Ambulatory Visit (INDEPENDENT_AMBULATORY_CARE_PROVIDER_SITE_OTHER): Payer: Managed Care, Other (non HMO) | Admitting: Cardiology

## 2015-02-07 ENCOUNTER — Encounter (INDEPENDENT_AMBULATORY_CARE_PROVIDER_SITE_OTHER): Payer: Managed Care, Other (non HMO)

## 2015-02-07 ENCOUNTER — Encounter: Payer: Self-pay | Admitting: Cardiology

## 2015-02-07 VITALS — BP 126/86 | HR 86 | Ht 75.0 in | Wt 198.4 lb

## 2015-02-07 DIAGNOSIS — R002 Palpitations: Secondary | ICD-10-CM

## 2015-02-07 DIAGNOSIS — R42 Dizziness and giddiness: Secondary | ICD-10-CM | POA: Diagnosis not present

## 2015-02-07 DIAGNOSIS — I10 Essential (primary) hypertension: Secondary | ICD-10-CM | POA: Diagnosis not present

## 2015-02-07 DIAGNOSIS — I456 Pre-excitation syndrome: Secondary | ICD-10-CM

## 2015-02-07 LAB — T4, FREE: FREE T4: 1.04 ng/dL (ref 0.80–1.80)

## 2015-02-07 LAB — TSH: TSH: 1.016 u[IU]/mL (ref 0.350–4.500)

## 2015-02-07 MED ORDER — METOPROLOL TARTRATE 25 MG PO TABS: 25.0000 mg | ORAL_TABLET | Freq: Two times a day (BID) | ORAL | Status: DC

## 2015-02-07 NOTE — Patient Instructions (Signed)
Medication Instructions:  Your physician has recommended you make the following change in your medication:  1. Start Lopressor ( 25 mg ) twice a day.  Sent in today to requested pharmacy.   Labwork: Your physician recommends that you have lab work today: T free 4/TSH   Testing/Procedures: Your physician has recommended that you wear an event monitor. Event monitors are medical devices that record the heart's electrical activity. Doctors most often us these monitors to diagnose arrhythmias. Arrhythmias are problems with the speed or rhythm of the heartbeat. The monitor is a small, portable device. You can wear one while you do your normal daily activities. This is usually used to diagnose what is causing palpitations/syncope (passing out).    Follow-Up: Your physician recommends that you keep your scheduled  follow-up appointment with Dr. Ladona Ridgelaylor.   Any Other Special Instructions Will Be Listed Below (If Applicable).     If you need a refill on your cardiac medications before your next appointment, please call your pharmacy.

## 2015-02-07 NOTE — Progress Notes (Signed)
Cardiology Office Note   Date:  02/07/2015   ID:  Adam SeatMichael L Couser, DOB 11/13/1970, MRN [id number]  PCP:  Carollee HerterLALONDE,JOHN CHARLES, MD  Cardiologist:   Dr. Ladona Ridgelaylor;   No chief complaint on file.     History of Present Illness: Adam Glover is a 44 y.o. male who presents for post ER visit for palpitations.  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  Troponin neg.  He was seen by PCP yesterday and with continued palpitations he made appointment for here.   He underwent catheter ablation in 1990. At that time, by his report, the ablation procedure was notable that his accessory pathway could not be ablated but his normal pathway reportedly was ablated.   Today [facility], will have scanned into EPIC.  Last visit with Dr. Ladona Ridgelaylor was 2014.  That time Dr. Ladona Ridgelaylor stated "If his AV node has been inadvertently damaged by catheter ablation, RF energy applied to the accessory pathway would likely result in complete heart block. Unfortunately, the patient may be having paroxysmal atrial fibrillation. We have no documentation however. In general his symptoms are much better than they were prior to ablation. Over time, patients with antegrade accessory pathway conduction will often lose their pathway conduction. This would result presumably in complete heart block. I've asked the patient to notify us if he has syncope, near syncope, or worsening tachypalpitations. At this point I have not recommended repeat catheter ablation. He may ultimately require beta blocker therapy to help control his symptoms."  Today he has two symptoms 1) his continued episodes of tachycardia that takes him to his knees and he takes slow deep breaths and it resolves, he has had this for some time.  2) are new episodes of palpitations and a "head rush that goes up and resolves," this is with dizziness, sometimes he feels as if he may pass out.  In addition his BP has been  elevated 157/103 at one point.  Today's bP is stable.  He has stayed out of work for 2 days and feels better.  Labs from the ER were normal.  He does not mention chest pain today.     Past Medical History  Diagnosis Date  . Herpes labialis   . Hypertriglyceridemia   . Seborrheic dermatitis   . Diverticulosis of colon     hx/o diverticulitis x 1  . WPW (Wolff-Parkinson-White syndrome) 1990    ablation  . Vitamin D deficiency   . Tinnitus   . Anal fissure   . IBS (irritable bowel syndrome)   . Hypertension     Past Surgical History  Procedure Laterality Date  . Cardiac electrophysiology mapping and ablation  1990    hx/o WPW  . Rectal condaloma procedure       Current Outpatient Prescriptions  Medication Sig Dispense Refill  . aspirin 325 MG tablet Take 325 mg by mouth daily.    . ciprofloxacin (CIPRO) 500 MG tablet Take 1 tablet (500 mg total) by mouth 2 (two) times daily. (Patient not taking: Reported on 02/05/2015) 20 tablet 0  . metroNIDAZOLE (FLAGYL) 500 MG tablet 1 pill by mouth twice per day.  Avoid any alcohol while taking this medicine. (Patient not taking: Reported on 02/05/2015) 20 tablet 0  . POTASSIUM GLUCONATE PO Take 1 tablet by mouth daily.     No current facility-administered medications for this visit.    Allergies:  Ilosone    Social History:  The patient  reports that he has never smoked. He does not have any smokeless tobacco history on file. He reports that he drinks alcohol. He reports that he does not use illicit drugs.   Family History:  The patient's family history includes Breast cancer in his maternal grandmother and mother; Cancer in his paternal grandmother; Colon polyps in his brother and mother; Dementia in his maternal grandmother; Diabetes in his brother, mother, and paternal grandmother; Heart disease in his father and maternal grandmother; Heart murmur in his paternal grandmother; Hypertension in his maternal grandmother; Other in his  father and maternal grandfather; Pancreatic cancer in his paternal grandfather; Prostate cancer (age of onset: 30) in his father; Seizures in his father; Stroke in his paternal grandmother; Vitamin D deficiency in his brother and father.    ROS:  General:no colds or fevers, no weight changes Skin:no rashes or ulcers HEENT:no blurred vision, no congestion CV:see HPI PUL:see HPI GI:no diarrhea constipation or melena, no indigestion GU:no hematuria, no dysuria MS:no joint pain, no claudication Neuro:no syncope, + lightheadedness Endo:no diabetes, no thyroid disease  Wt Readings from Last 3 Encounters:  02/06/15 202 lb (91.627 kg)  02/05/15 210 lb (95.255 kg)  08/20/14 199 lb (90.266 kg)     PHYSICAL EXAM: VS:  There were no vitals taken for this visit. , BMI There is no weight on file to calculate BMI. General:Pleasant affect, NAD Skin:Warm and dry, brisk capillary refill HEENT:normocephalic, sclera clear, mucus membranes moist Neck:supple, no JVD, no bruits  Heart:S1S2 RRR without murmur, gallup, rub or click Lungs:clear without rales, rhonchi, or wheezes ZOX:WRUE, non tender, + BS, do not palpate liver spleen or masses Ext:no lower ext edema, 2+ pedal pulses, 2+ radial pulses Neuro:alert and oriented X 3, MAE, follows commands, + facial symmetry    EKG:  EKG is NOT ordered today. The ekg done in Er and yesterday at PCP's office ST at ER with LBBB and 02/06/15 LBBB short PR with HR 74   Recent Labs: 02/05/2015: ALT 30; BUN 19; Creatinine, Ser 1.14; Hemoglobin 14.6; Platelets 153; Potassium 3.8; Sodium 136    Lipid Panel    Component Value Date/Time   CHOL 199 03/20/2012 1011   TRIG 163* 03/20/2012 1011   HDL 37* 03/20/2012 1011   CHOLHDL 5.4 03/20/2012 1011   VLDL 33 03/20/2012 1011   LDLCALC 129* 03/20/2012 1011       Other studies Reviewed: Additional studies/ records that were reviewed today include: ER notes, PCP notes,.   ASSESSMENT AND PLAN:  1.   Palpitations with dizziness- discussed with Dr. Clifton James - will do 30 day event monitor and will add Lopressor 25 mg BID.  He will follow up with Dr. Ladona Ridgel in 3-4 weeks. Will check thyroid     2.  HTN adding BB will help with BP as well.  3. 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  Retrograde conduction  Is greater than antegrade conduction.  Attempts to damage this pathway with radiofrequency current were unsuccessful.the pathway appears in close approximation with the HIS bundle in that sites recording early ventricular activation during SR"     See full notes      Current medicines are reviewed with the patient today.  The patient Has no concerns regarding medicines.  The following changes have been made:  See above Labs/ tests ordered today include:see above  Disposition:  FU:  see above  Nyoka Lint, NP  02/07/2015 10:01 AM    [facility] [address] [place], [place], [place]  [address]/ [address] [place], [place] Phone: [phone]; Fax: [phone]  [phone]

## 2015-02-08 ENCOUNTER — Encounter: Payer: Self-pay | Admitting: Cardiology

## 2015-02-28 ENCOUNTER — Ambulatory Visit: Payer: Managed Care, Other (non HMO) | Admitting: Internal Medicine

## 2015-03-18 ENCOUNTER — Encounter: Payer: Self-pay | Admitting: Internal Medicine

## 2015-03-18 ENCOUNTER — Ambulatory Visit (INDEPENDENT_AMBULATORY_CARE_PROVIDER_SITE_OTHER): Payer: Managed Care, Other (non HMO) | Admitting: Internal Medicine

## 2015-03-18 VITALS — BP 124/86 | HR 68 | Ht 75.0 in | Wt 195.6 lb

## 2015-03-18 DIAGNOSIS — I456 Pre-excitation syndrome: Secondary | ICD-10-CM

## 2015-03-18 NOTE — Patient Instructions (Signed)
Medication Instructions:  Your physician recommends that you continue on your current medications as directed. Please refer to the Current Medication list given to you today.   Labwork: None ordered   Testing/Procedures: Your physician has recommended that you have an ablation. Catheter ablation is a medical procedure used to treat some cardiac arrhythmias (irregular heartbeats). During catheter ablation, a long, thin, flexible tube is put into a blood vessel in your groin (upper thigh), or neck. This tube is called an ablation catheter. It is then guided to your heart through the blood vessel. Radio frequency waves destroy small areas of heart tissue where abnormal heartbeats may cause an arrhythmia to start. Please see the instruction sheet given to you today.  Dates to do procedure: 1/30, 2/8, 2/15, 2/20, 2/28, 3/6, 3/8, 3/16, 3/24, 3/27 Call Dennis BastKelly Lanier, RN to schedule    Follow-Up: Your physician recommends that you schedule a follow-up appointment after ablation   Any Other Special Instructions Will Be Listed Below (If Applicable).     If you need a refill on your cardiac medications before your next appointment, please call your pharmacy.

## 2015-03-18 NOTE — Progress Notes (Signed)
HPI [name] returns today after a two year absence from our arrhythmia clinic. He is a 45 yo man with a h/o WPW and palpitations. [facility]. We do not have details of this. He has had recurrent palpitations. He has marked ventricular pre-excitation. His mother who is with him today states that they were told that the "normal " electrical connection was damaged but not the abnormal connection. Through the years, he has had recurrent tachypalpitations. He can usually stop these with vagal/valsalva maneuvers. The patient had recurrent symptoms several weeks ago and underwent a 24 hour holter. He had NSR with PAC's and ventricular pre-excitation but no sustained arrhythmias.  Allergies  Allergen Reactions  . Ilosone [Erythromycin Estolate] Diarrhea    blisters     Current Outpatient Prescriptions  Medication Sig Dispense Refill  . aspirin 325 MG tablet Take 325 mg by mouth daily.    . metoprolol tartrate (LOPRESSOR) 25 MG tablet Take 1 tablet (25 mg total) by mouth 2 (two) times daily. 60 tablet 11  . valACYclovir (VALTREX) 1000 MG tablet Use as directed     No current facility-administered medications for this visit.     Past Medical History  Diagnosis Date  . Herpes labialis   . Hypertriglyceridemia   . Seborrheic dermatitis   . Diverticulosis of colon     hx/o diverticulitis x 1  . WPW (Wolff-Parkinson-White syndrome) 1990    ablation  . Vitamin D deficiency   . Tinnitus   . Anal fissure   . IBS (irritable bowel syndrome)   . Hypertension     ROS:   All systems reviewed and negative except as noted in the HPI.   Past Surgical History  Procedure Laterality Date  . Cardiac electrophysiology mapping and ablation  1990    hx/o WPW  . Rectal condaloma procedure       Family History  Problem Relation Age of Onset  . Breast cancer Maternal Grandmother   . Hypertension Maternal Grandmother   . Dementia Maternal  Grandmother   . Cancer Paternal Grandmother   . Diabetes Paternal Grandmother   . Stroke Paternal Grandmother   . Pancreatic cancer Paternal Grandfather     questionable  . Breast cancer Mother   . Diabetes Mother   . Prostate cancer Father [id number]  . Seizures Father   . Heart disease Father     BBB  . Vitamin D deficiency Father   . Vitamin D deficiency Brother   . Diabetes Brother   . Colon polyps Mother   . Colon polyps Brother   . Heart disease Maternal Grandmother   . Heart murmur Paternal Grandmother   . Other Father     bundle branch block  . Other Maternal Grandfather     mucoid cancer     Social History   Social History  . Marital Status: Single    Spouse Name: N/A  . Number of Children: N/A  . Years of Education: N/A   Occupational History  . Not on file.   Social History Main Topics  . Smoking status: Never Smoker   . Smokeless tobacco: Not on file  . Alcohol Use: 0.0 oz/week     Comment: 1-2 drinks daily  . Drug Use: No  . Sexual Activity: Yes    Birth Control/ Protection: Condom   Other Topics Concern  . Not on file   Social History Narrative   In relationship with  another male x 1+ year, but hx/o multiple sexual partners, not exercising     BP 124/86 mmHg  Pulse 68  Ht 6\' 3"  (1.905 m)  Wt 195 lb 9.6 oz (88.724 kg)  BMI 24.45 kg/m2  Physical Exam:  Well appearing 45 yo man, NAD HEENT: Unremarkable Neck:  6 cm JVD, no thyromegally Lymphatics:  No adenopathy Back:  No CVA tenderness Lungs:  Clear with no wheezes HEART:  Regular rate rhythm, no murmurs, no rubs, no clicks Abd:  soft, positive bowel sounds, no organomegally, no rebound, no guarding Ext:  2 plus pulses, no edema, no cyanosis, no clubbing Skin:  No rashes no nodules Neuro:  CN II through XII intact, motor grossly intact  EKG - NSR with marked ventricular pre-excitation. Transition from negative to positive at V3.   Assess/Plan: 1. WPW - he still has marked pre-excitation  which is worrisome. He may be having orthodromic or antidromic tachycardia or atrial fib with conduction over his AP. The patient's AP conduction will not likely last indefinitely. He could develop heart block. He could also have atrial fib and VF over a malignant pathway.  2. Palpitations - the mechanism of his symptoms is still unclear. He could be having anti-dromic tachycardia going antegrade over the pathway and retrograde over the AV node or another pathway. 3. HTN - this is a new problem. He is on metoprolol. He would like to stop this medication but is also using to control palpitations.  4. Anxiety - he is appropriately anxious about his arrhythmia. I have discussed the treatment options regarding EP testing and catheter ablation. He understands that he is currently at risk for sudden death, heart block, recurrent SVT and is willing to undergo EP study. If he has residual conduction over his AV node, then we would consider ablation of his AP. If no residual conduction, then we would not ablate at this point.   Leonia Reeves.D.

## 2015-06-27 ENCOUNTER — Ambulatory Visit (INDEPENDENT_AMBULATORY_CARE_PROVIDER_SITE_OTHER): Payer: Managed Care, Other (non HMO) | Admitting: Medical

## 2015-06-27 ENCOUNTER — Encounter: Payer: Self-pay | Admitting: Medical

## 2015-06-27 VITALS — BP 124/90 | HR 75 | Wt 195.0 lb

## 2015-06-27 DIAGNOSIS — H6502 Acute serous otitis media, left ear: Secondary | ICD-10-CM | POA: Diagnosis not present

## 2015-06-27 NOTE — Progress Notes (Signed)
Subjective: Chief Complaint  Patient presents with  . ear popping    for about a month this has been going on only the lt ear. not all the time. sounds like "krinkly plastic" in his ear.    Here for ear popping on left only.  Been going on 3 weeks, feels crinkly sounding.  Sinuses are clear, no recent cough, congestion, pain, no sore throat, no allergy problems.   No recent ear trauma, no recent loud noise.    No ear drainage.   No recent flying or scuba diving.  No other aggravating or relieving factors. No other complaint.   Past Medical History  Diagnosis Date  . Herpes labialis   . Hypertriglyceridemia   . Seborrheic dermatitis   . Diverticulosis of colon     hx/o diverticulitis x 1  . WPW (Wolff-Parkinson-White syndrome) 1990    ablation  . Vitamin D deficiency   . Tinnitus   . Anal fissure   . IBS (irritable bowel syndrome)   . Hypertension    ROS as in subjective   Objective: BP 124/90 mmHg  Pulse 75  Wt 195 lb (88.451 kg)  General appearance: alert, no distress, WD/WN, HEENT: normocephalic, sclerae anicteric, right TM pearly, left TM with mild serous effusion, no erythema, nares patent, no discharge or erythema, pharynx normal Oral cavity: MMM, no lesions Neck: supple, no lymphadenopathy, no thyromegaly, no masses    Assessment: Encounter Diagnosis  Name Primary?  . Acute serous otitis media of left ear, recurrence not specified Yes     Plan: Discussed symptoms ,exam, begin OTC antihistamine or nasal spray such as Flonase, hydrate well.  If not much improved or worse by mid next week then call back.

## 2015-12-30 ENCOUNTER — Other Ambulatory Visit: Payer: Self-pay | Admitting: Cardiology

## 2015-12-30 DIAGNOSIS — R002 Palpitations: Secondary | ICD-10-CM

## 2015-12-30 NOTE — Telephone Encounter (Signed)
Please call the office to schedule a follow up appt for future refill [phone]

## 2016-01-24 ENCOUNTER — Ambulatory Visit (INDEPENDENT_AMBULATORY_CARE_PROVIDER_SITE_OTHER): Payer: Managed Care, Other (non HMO) | Admitting: Family Medicine

## 2016-01-24 VITALS — BP 130/90 | HR 81 | Temp 98.3°F | Resp 16 | Ht 75.0 in | Wt 204.0 lb

## 2016-01-24 DIAGNOSIS — K5732 Diverticulitis of large intestine without perforation or abscess without bleeding: Secondary | ICD-10-CM

## 2016-01-24 DIAGNOSIS — R1032 Left lower quadrant pain: Secondary | ICD-10-CM

## 2016-01-24 LAB — POCT CBC
Granulocyte percent: 62.2 %G (ref 37–80)
HCT, POC: 47.6 % (ref 43.5–53.7)
HEMOGLOBIN: 17.1 g/dL (ref 14.1–18.1)
LYMPH, POC: 2.4 (ref 0.6–3.4)
MCH, POC: 35.3 pg — AB (ref 27–31.2)
MCHC: 36 g/dL — AB (ref 31.8–35.4)
MCV: 98.3 fL — AB (ref 80–97)
MID (cbc): 0.3 (ref 0–0.9)
MPV: 8.3 fL (ref 0–99.8)
POC GRANULOCYTE: 4.5 (ref 2–6.9)
POC LYMPH PERCENT: 33.3 %L (ref 10–50)
POC MID %: 4.5 %M (ref 0–12)
Platelet Count, POC: 161 10*3/uL (ref 142–424)
RBC: 4.85 M/uL (ref 4.69–6.13)
RDW, POC: 12 %
WBC: 7.2 10*3/uL (ref 4.6–10.2)

## 2016-01-24 MED ORDER — METRONIDAZOLE 500 MG PO TABS: 500.0000 mg | ORAL_TABLET | Freq: Three times a day (TID) | ORAL | 0 refills | Status: DC

## 2016-01-24 MED ORDER — CIPROFLOXACIN HCL 500 MG PO TABS: 500.0000 mg | ORAL_TABLET | Freq: Two times a day (BID) | ORAL | 0 refills | Status: DC

## 2016-01-24 NOTE — Progress Notes (Signed)
Chief Complaint  Patient presents with  . Diverticulitis    HPI  Diverticulitis: Patient complains of LLQ abdominal pain.  The pain is described as dull, aching pain and is 3/10 in intensity. Onset was 2 days ago. Symptoms have been stable since. Aggravating factors: recumbency.  Alleviating factors: bowel movements. Associated symptoms: constipation. The patient denies chills, diarrhea, fever, hematochezia, melena, mucus in the stool, nausea, night sweats and vomiting.  He changed to a bland diet since his symptoms started.  [facility] and had sunflower seeds 2 days ago. Last episode of diverticulitis was a year ago    Past Medical History:  Diagnosis Date  . Anal fissure   . Diverticulosis of colon    hx/o diverticulitis x 1  . Herpes labialis   . Hypertension   . Hypertriglyceridemia   . IBS (irritable bowel syndrome)   . Seborrheic dermatitis   . Tinnitus   . Vitamin D deficiency   . WPW (Wolff-Parkinson-White syndrome) 1990   ablation    Current Outpatient Prescriptions  Medication Sig Dispense Refill  . aspirin 81 MG chewable tablet Chew by mouth daily.    . cholecalciferol (VITAMIN D) 1000 units tablet Take 1,000 Units by mouth daily.    . metoprolol tartrate (LOPRESSOR) 25 MG tablet Take 1 tablet (25 mg total) by mouth 2 (two) times daily. Please call the office to schedule a follow up appt for future refill [phone] 60 tablet 1  . valACYclovir (VALTREX) 1000 MG tablet Reported on 06/27/2015    . ciprofloxacin (CIPRO) 500 MG tablet Take 1 tablet (500 mg total) by mouth 2 (two) times daily. 14 tablet 0  . metroNIDAZOLE (FLAGYL) 500 MG tablet Take 1 tablet (500 mg total) by mouth 3 (three) times daily. 21 tablet 0   No current facility-administered medications for this visit.     Allergies:  Allergies  Allergen Reactions  . Ilosone [Erythromycin Estolate] Diarrhea    blisters    Past Surgical History:  Procedure Laterality Date  .  CARDIAC ELECTROPHYSIOLOGY MAPPING AND ABLATION  1990   hx/o WPW  . rectal condaloma procedure      Social History   Social History  . Marital status: Single    Spouse name: N/A  . Number of children: N/A  . Years of education: N/A   Social History Main Topics  . Smoking status: Never Smoker  . Smokeless tobacco: None  . Alcohol use 0.0 oz/week     Comment: 1-2 drinks daily  . Drug use: No  . Sexual activity: Yes    Birth control/ protection: Condom   Other Topics Concern  . None   Social History Narrative   In relationship with another male x 1+ year, but hx/o multiple sexual partners, not exercising    ROS  Objective: Vitals:   01/24/16 1141  BP: 130/90  Pulse: 81  Resp: 16  Temp: 98.3 F (36.8 C)  TempSrc: Oral  SpO2: 97%  Weight: 204 lb (92.5 kg)  Height: 6\' 3"  (1.905 m)    Physical Exam  Constitutional: He appears well-developed and well-nourished.  Eyes: Conjunctivae and EOM are normal.  Cardiovascular: Normal rate, regular rhythm and normal heart sounds.   Pulmonary/Chest: Breath sounds normal. No respiratory distress. He has no wheezes.  Abdominal: Soft. Normal appearance and bowel sounds are normal. He exhibits no shifting dullness, no distension, no fluid wave and no pulsatile midline mass. There is tenderness in the left lower quadrant. There is  no rigidity, no rebound, no guarding, no CVA tenderness, no tenderness at McBurney's point and negative Murphy's sign. No hernia.      Assessment and Plan [name] was seen today for diverticulitis.  Diagnoses and all orders for this visit:  Continuous LLQ abdominal pain Diverticulitis- checked cbc and no leukocytosis Will treat conservatively given patients symptoms Plan to continue bland diet Cipro/flagyl Follow up prn Reintroduce fiber once symptoms resolve  -     POCT CBC  si -     metroNIDAZOLE (FLAGYL) 500 MG tablet; Take 1 tablet (500 mg total) by mouth 3 (three) times daily. -      ciprofloxacin (CIPRO) 500 MG tablet; Take 1 tablet (500 mg total) by mouth 2 (two) times daily.     Makinze Jani A Cyndra Feinberg

## 2016-01-24 NOTE — Patient Instructions (Addendum)
     IF you received an x-ray today, you will receive an invoice from [facility]. Please contact [facility] at [phone] with questions or concerns regarding your invoice.   IF you received labwork today, you will receive an invoice from [facility]. Please contact [facility] at [phone] with questions or concerns regarding your invoice.   Our billing staff will not be able to assist you with questions regarding bills from these companies.  You will be contacted with the lab results as soon as they are available. The fastest way to get your results is to activate your My Chart account. Instructions are located on the last page of this paperwork. If you have not heard from us regarding the results in 2 weeks, please contact this office.     Diverticulitis Diverticulitis is when small pockets that have formed in your colon (large intestine) become infected or swollen. Follow these instructions at home:  Follow your doctor's instructions.  Follow a special diet if told by your doctor.  When you feel better, your doctor may tell you to change your diet. You may be told to eat a lot of fiber. Fruits and vegetables are good sources of fiber. Fiber makes it easier to poop (have bowel movements).  Take supplements or probiotics as told by your doctor.  Only take medicines as told by your doctor.  Keep all follow-up visits with your doctor. Contact a doctor if:  Your pain does not get better.  You have a hard time eating food.  You are not pooping like normal. Get help right away if:  Your pain gets worse.  Your problems do not get better.  Your problems suddenly get worse.  You have a fever.  You keep throwing up (vomiting).  You have bloody or black, tarry poop (stool). This information is not intended to replace advice given to you by your health care provider. Make sure you discuss any questions you have with your health  care provider. Document Released: 08/11/2007 Document Revised: 07/31/2015 Document Reviewed: 01/17/2013 Elsevier Interactive Patient Education  2017 [employer]

## 2016-01-25 NOTE — Progress Notes (Signed)
See how he is doing 

## 2016-02-03 ENCOUNTER — Other Ambulatory Visit: Payer: Self-pay

## 2016-02-03 DIAGNOSIS — R002 Palpitations: Secondary | ICD-10-CM

## 2016-02-04 MED ORDER — METOPROLOL TARTRATE 25 MG PO TABS: 25.0000 mg | ORAL_TABLET | Freq: Two times a day (BID) | ORAL | 0 refills | Status: DC

## 2016-03-10 ENCOUNTER — Other Ambulatory Visit: Payer: Self-pay | Admitting: Family Medicine

## 2016-03-31 ENCOUNTER — Other Ambulatory Visit: Payer: Self-pay | Admitting: Internal Medicine

## 2016-03-31 DIAGNOSIS — R002 Palpitations: Secondary | ICD-10-CM

## 2016-04-16 ENCOUNTER — Other Ambulatory Visit: Payer: Self-pay | Admitting: Internal Medicine

## 2016-04-16 DIAGNOSIS — R002 Palpitations: Secondary | ICD-10-CM

## 2016-04-21 ENCOUNTER — Telehealth: Payer: Self-pay | Admitting: Internal Medicine

## 2016-04-21 NOTE — Telephone Encounter (Signed)
Spoke with patient and made him aware that he received a quantity of fourteen because he needs an appointment. Patient will call his pcp to see if they will refill the medication and if not he will call back tomorrow and schedule an appointment here.

## 2016-04-21 NOTE — Telephone Encounter (Signed)
New Message     *STAT* If patient is at the pharmacy, call can be transferred to refill team.   1. Which medications need to be refilled? (please list name of each medication and dose if known) metoprolol tartrate (LOPRESSOR) 25 MG tablet  2. Which pharmacy/location (including street and city if local pharmacy) is medication to be sent to[facility] [place]  3. Do they need a 30 day or 90 day supply? Pt states that pharmacy only gave him 14 pills

## 2016-04-23 ENCOUNTER — Encounter: Payer: Self-pay | Admitting: Family Medicine

## 2016-04-23 ENCOUNTER — Ambulatory Visit (INDEPENDENT_AMBULATORY_CARE_PROVIDER_SITE_OTHER): Payer: Managed Care, Other (non HMO) | Admitting: Family Medicine

## 2016-04-23 VITALS — BP 118/70 | HR 81 | Wt 211.0 lb

## 2016-04-23 DIAGNOSIS — B001 Herpesviral vesicular dermatitis: Secondary | ICD-10-CM

## 2016-04-23 DIAGNOSIS — I10 Essential (primary) hypertension: Secondary | ICD-10-CM

## 2016-04-23 DIAGNOSIS — I456 Pre-excitation syndrome: Secondary | ICD-10-CM | POA: Diagnosis not present

## 2016-04-23 DIAGNOSIS — K5730 Diverticulosis of large intestine without perforation or abscess without bleeding: Secondary | ICD-10-CM | POA: Diagnosis not present

## 2016-04-23 DIAGNOSIS — R002 Palpitations: Secondary | ICD-10-CM | POA: Diagnosis not present

## 2016-04-23 LAB — COMPREHENSIVE METABOLIC PANEL
ALT: 18 U/L (ref 9–46)
AST: 17 U/L (ref 10–40)
Albumin: 4.4 g/dL (ref 3.6–5.1)
Alkaline Phosphatase: 88 U/L (ref 40–115)
BUN: 24 mg/dL (ref 7–25)
CO2: 24 mmol/L (ref 20–31)
CREATININE: 1.19 mg/dL (ref 0.60–1.35)
Calcium: 9 mg/dL (ref 8.6–10.3)
Chloride: 103 mmol/L (ref 98–110)
Glucose, Bld: 91 mg/dL (ref 65–99)
Potassium: 4.3 mmol/L (ref 3.5–5.3)
SODIUM: 136 mmol/L (ref 135–146)
TOTAL PROTEIN: 7.1 g/dL (ref 6.1–8.1)
Total Bilirubin: 0.5 mg/dL (ref 0.2–1.2)

## 2016-04-23 LAB — CBC WITH DIFFERENTIAL/PLATELET
BASOS PCT: 1 %
Basophils Absolute: 64 cells/uL (ref 0–200)
EOS ABS: 128 {cells}/uL (ref 15–500)
EOS PCT: 2 %
HCT: 43.4 % (ref 38.5–50.0)
Hemoglobin: 14.8 g/dL (ref 13.2–17.1)
Lymphocytes Relative: 46 %
Lymphs Abs: 2944 cells/uL (ref 850–3900)
MCH: 33.3 pg — ABNORMAL HIGH (ref 27.0–33.0)
MCHC: 34.1 g/dL (ref 32.0–36.0)
MCV: 97.7 fL (ref 80.0–100.0)
MONOS PCT: 10 %
MPV: 10.9 fL (ref 7.5–12.5)
Monocytes Absolute: 640 cells/uL (ref 200–950)
NEUTROS ABS: 2624 {cells}/uL (ref 1500–7800)
Neutrophils Relative %: 41 %
PLATELETS: 164 10*3/uL (ref 140–400)
RBC: 4.44 MIL/uL (ref 4.20–5.80)
RDW: 13.4 % (ref 11.0–15.0)
WBC: 6.4 10*3/uL (ref 4.0–10.5)

## 2016-04-23 LAB — LIPID PANEL
CHOLESTEROL: 190 mg/dL (ref <200)
HDL: 30 mg/dL — ABNORMAL LOW (ref >40)
Total CHOL/HDL Ratio: 6.3 Ratio — ABNORMAL HIGH (ref <5.0)
Triglycerides: 442 mg/dL — ABNORMAL HIGH (ref <150)

## 2016-04-23 MED ORDER — VALACYCLOVIR HCL 1 G PO TABS: ORAL_TABLET | ORAL | 1 refills | Status: DC

## 2016-04-23 MED ORDER — METOPROLOL TARTRATE 25 MG PO TABS: ORAL_TABLET | ORAL | 3 refills | Status: DC

## 2016-04-23 NOTE — Progress Notes (Signed)
   Subjective:    Patient ID: Adam Glover, male    DOB: 11/04/1970, 46 y.o.   MRN: [id number]  HPI Here for medication management visit. He does have underlying hypertension as well as WPW. He was seen approximately one year ago and further evaluation and possible ablation was mentioned however he has been reluctant to follow-up on it. He has not had any palpitations, chest pain, syncopal episodes. He does have a history of herpes labialis and does use Valtrex on an as-needed basis. He also has a previous history of 2 episodes of diverticulitis but otherwise has been doing well. No other concerns or complaints.   Review of Systems     Objective:   Physical Exam Alert and in no distress. Tympanic membranes and canals are normal. Pharyngeal area is normal. Neck is supple without adenopathy or thyromegaly. Cardiac exam shows a regular sinus rhythm without murmurs or gallops. Lungs are clear to auscultation.        Assessment & Plan:  Essential hypertension - Plan: CBC with Differential/Platelet, Comprehensive metabolic panel, Lipid panel  WPW syndrome  Herpes labialis - Plan: valACYclovir (VALTREX) 1000 MG tablet  Heart palpitations - Plan: metoprolol tartrate (LOPRESSOR) 25 MG tablet  Diverticulosis of large intestine without hemorrhage His medications were renewed. I reviewed the note from cardiology concerning the WPW and explained to him what the cardiologists concerns were and the need for further potential intervention including possibly needing a pacemaker. He did understand better what is involved. I tried to explain the different pathways and the possibilities of ablation versus having to put a pacemaker in. I also again reinforced the possibility of sudden death because of an arrhythmia. He will consider this. Told him to call me or call directly to cardiology to get this taken care of.

## 2016-07-09 ENCOUNTER — Encounter: Payer: Self-pay | Admitting: Family Medicine

## 2016-07-09 ENCOUNTER — Ambulatory Visit (INDEPENDENT_AMBULATORY_CARE_PROVIDER_SITE_OTHER): Payer: Managed Care, Other (non HMO) | Admitting: Family Medicine

## 2016-07-09 VITALS — BP 128/88 | HR 64 | Wt 203.0 lb

## 2016-07-09 DIAGNOSIS — J340 Abscess, furuncle and carbuncle of nose: Secondary | ICD-10-CM

## 2016-07-09 DIAGNOSIS — I456 Pre-excitation syndrome: Secondary | ICD-10-CM | POA: Diagnosis not present

## 2016-07-09 DIAGNOSIS — J3489 Other specified disorders of nose and nasal sinuses: Secondary | ICD-10-CM | POA: Diagnosis not present

## 2016-07-09 MED ORDER — DOXYCYCLINE HYCLATE 100 MG PO TABS: 100.0000 mg | ORAL_TABLET | Freq: Two times a day (BID) | ORAL | 0 refills | Status: DC

## 2016-07-09 MED ORDER — MUPIROCIN CALCIUM 2 % NA OINT: 1.0000 "application " | TOPICAL_OINTMENT | Freq: Two times a day (BID) | NASAL | 0 refills | Status: DC

## 2016-07-09 NOTE — Addendum Note (Signed)
Addended by: Minette HeadlandBENFIELD, Nana Hoselton L on: 07/09/2016 03:33 PM   Modules accepted: Orders

## 2016-07-09 NOTE — Progress Notes (Signed)
   Subjective:    Patient ID: Adam Glover, male    DOB: 08/10/1970, 46 y.o.   MRN: [id number]  HPI He comes in for consult concerning infection and his nose. His reoccurred. He had a previous episode of this several years ago and was treated with Bactroban and doxycycline. He would like this again. Also review of his record indicates that he did follow-up with Dr. Ladona Ridgelaylor concerning and ablation for his WPW. His concern was he felt as if a pacemaker was going to be placed and that was something that scared him.   Review of Systems     Objective:   Physical Exam Alert and in no distress. Exam of the nose does show some crusting anteriorly is left greater than right.       Assessment & Plan:  WPW syndrome  Cellulitis of mucous membrane of nose - Plan: doxycycline (VIBRA-TABS) 100 MG tablet, mupirocin nasal ointment (BACTROBAN NASAL) 2 %  He will call if continued difficulty with the nose. I then discussed the WPW and read the notes from Dr. Ladona Ridgelaylor. Discussed an ablation with him. I strongly encouraged him to set up another appointment with Dr. Ladona Ridgelaylor to discuss the ablation and go into more detail about the possible need for a pacemaker. He did state that he felt the metoprolol had really put him in second year instead of fourth care and he didn't like that.

## 2016-07-14 ENCOUNTER — Telehealth: Payer: Self-pay | Admitting: Family Medicine

## 2016-07-14 NOTE — Telephone Encounter (Signed)
Pt informed and verbalized undestanding

## 2016-07-14 NOTE — Telephone Encounter (Signed)
Doxycycline can do that but it is unusual. Recommend he use over-the-counter Prilosec to see if that will help but take it at a time separate from the doxycycline

## 2016-07-14 NOTE — Telephone Encounter (Signed)
Pt came in and stated that since yesterday he has being having issues with heartburn/indigestion. The pain varies in level but has not gone away. He did look on computer and noted that Doxycycline can cause those types of issues. He wants to know if this is something to be concerned about and what should he do about it. Pt was offered an appt but he declined stating he just wanted a not back. [facility] [address] and can be reached at [phone].

## 2016-07-30 ENCOUNTER — Ambulatory Visit (INDEPENDENT_AMBULATORY_CARE_PROVIDER_SITE_OTHER): Payer: Managed Care, Other (non HMO) | Admitting: Medical

## 2016-07-30 ENCOUNTER — Encounter: Payer: Self-pay | Admitting: Medical

## 2016-07-30 VITALS — BP 114/84 | HR 71 | Temp 98.1°F | Wt 205.6 lb

## 2016-07-30 DIAGNOSIS — J988 Other specified respiratory disorders: Secondary | ICD-10-CM | POA: Diagnosis not present

## 2016-07-30 DIAGNOSIS — L989 Disorder of the skin and subcutaneous tissue, unspecified: Secondary | ICD-10-CM

## 2016-07-30 MED ORDER — AMOXICILLIN 500 MG PO TABS: ORAL_TABLET | ORAL | 0 refills | Status: DC

## 2016-07-30 NOTE — Patient Instructions (Addendum)
Recommendations  Rest  Drink plenty of water to flush out the mucous  Begin either Mucinex DM or Benadryl over the counter to help clear the mucous  Begin Amoxicillin oral antibiotic  If not much better within a week, then call back or recheck

## 2016-07-30 NOTE — Progress Notes (Signed)
Subjective:  Adam Glover is a 46 y.o. male who presents for illness, cough.  Chief Complaint  Patient presents with  . chest congestion    fever, coiughing, runny nose, congestion    He reports 4 day hx/o sudden onset of cough, some sore throat, low grade fever, but no wheezing, no SOB, no NVD, no ear pain.  Using nothing for symptoms.   It moved to chest quickly.   He also has a relatively new skin lesion in scalp.  Wants it looked at.  No other aggravating or relieving factors.  No other c/o.  The following portions of the patient's history were reviewed and updated as appropriate: allergies, current medications, past family history, past medical history, past social history, past surgical history and problem list.  ROS as in subjective  Past Medical History:  Diagnosis Date  . Anal fissure   . Diverticulosis of colon    hx/o diverticulitis x 1  . Herpes labialis   . Hypertension   . Hypertriglyceridemia   . IBS (irritable bowel syndrome)   . Seborrheic dermatitis   . Tinnitus   . Vitamin D deficiency   . WPW (Wolff-Parkinson-White syndrome) 1990   ablation     Objective: BP 114/84   Pulse 71   Temp 98.1 F (36.7 C)   Wt 205 lb 9.6 oz (93.3 kg)   SpO2 98%   BMI 25.70 kg/m   General appearance: Alert, WD/WN, no distress,somewhat ill appearing                             Skin: warm, no rash, no diaphoresis                           Head: no sinus tenderness                            Eyes: conjunctiva normal, corneas clear, PERRLA                            Ears: pearly TMs, external ear canals normal                          Nose: septum midline, turbinates swollen, with erythema and clear discharge             Mouth/throat: MMM, tongue normal, mild pharyngeal erythema                           Neck: supple, no adenopathy, no thyromegaly, nontender                          Heart: RRR, normal S1, S2, no murmurs                         Lungs: +bronchial breath  sounds, +scattered rhonchi, no wheezes, no rales                Extremities: no edema, nontender     Assessment: Encounter Diagnoses  Name Primary?  Marland Kitchen Respiratory tract infection Yes  . Skin lesion      Plan:  Begin medication below, mucinex DM, rest, hydration, supportive care as discussed.  Call/recheck if not improving  in the next 4-5 days.    Skin lesion - since its relative new and growing, advised he f/u with [facility] who he already sees.  Adam NeedleMichael was seen today for chest congestion.  Diagnoses and all orders for this visit:  Respiratory tract infection  Skin lesion  Other orders -     amoxicillin (AMOXIL) 500 MG tablet; 2 tablets po BID x 10 days

## 2016-08-19 ENCOUNTER — Ambulatory Visit
Admission: RE | Admit: 2016-08-19 | Discharge: 2016-08-19 | Disposition: A | Discharge status: Home / Self Care | Payer: Managed Care, Other (non HMO) | Source: Ambulatory Visit | Attending: Family Medicine | Admitting: Family Medicine

## 2016-08-19 ENCOUNTER — Other Ambulatory Visit: Payer: Self-pay | Admitting: Family Medicine

## 2016-08-19 ENCOUNTER — Ambulatory Visit (INDEPENDENT_AMBULATORY_CARE_PROVIDER_SITE_OTHER): Payer: Managed Care, Other (non HMO) | Admitting: Family Medicine

## 2016-08-19 ENCOUNTER — Encounter: Payer: Self-pay | Admitting: Family Medicine

## 2016-08-19 VITALS — BP 120/70 | HR 65 | Wt 205.0 lb

## 2016-08-19 DIAGNOSIS — L989 Disorder of the skin and subcutaneous tissue, unspecified: Secondary | ICD-10-CM

## 2016-08-19 DIAGNOSIS — R062 Wheezing: Secondary | ICD-10-CM

## 2016-08-19 DIAGNOSIS — K625 Hemorrhage of anus and rectum: Secondary | ICD-10-CM

## 2016-08-19 DIAGNOSIS — Z209 Contact with and (suspected) exposure to unspecified communicable disease: Secondary | ICD-10-CM | POA: Diagnosis not present

## 2016-08-19 DIAGNOSIS — J988 Other specified respiratory disorders: Secondary | ICD-10-CM | POA: Diagnosis not present

## 2016-08-19 MED ORDER — AMOXICILLIN-POT CLAVULANATE 875-125 MG PO TABS: 1.0000 | ORAL_TABLET | Freq: Two times a day (BID) | ORAL | 0 refills | Status: DC

## 2016-08-19 NOTE — Patient Instructions (Signed)
You can take 2 Tylenol 4 times per day for the arthritis symptoms and if that doesn't work then try Aleve but take 2 tablets twice per day

## 2016-08-19 NOTE — Progress Notes (Signed)
   Subjective:    Patient ID: Adam Glover, male    DOB: 03/22/1970, 46 y.o.   MRN: [id number]  HPI He was seen on May 25 and treated for an upper respiratory infection. He states that the cough is better as well as the night sweats have now gone away. He does occasionally note difficulty with a wheezing sensation in his right mid chest area but no fever, chills, sore throat or earache chest congestion. He also has a lesion on his scalp that he would like me to look at. He has had some recent sexual exposure and would like to be STD tested to be safe. He also has had one episode of rectal bleeding after a BM. He did try self examination and did see a small tear in the anal area. He's had no bleeding in the last several days. I'll habits have been normal.   Review of Systems     Objective:   Physical Exam Alert and in no distress. Tympanic membranes and canals are normal. Pharyngeal area is normal. Neck is supple without adenopathy or thyromegaly. Cardiac exam shows a regular sinus rhythm without murmurs or gallops. Lungs are clear to auscultation.Exam of the scalp does show a 1 cm raised lesion with well-defined margins slightly pigmented. Anal exam shows no external hemorrhoid or other lesions. There is a questionable healing fissure at around 12:00.       Assessment & Plan:  Wheezing - Plan: DG Chest 2 View  Respiratory tract infection - Plan: DG Chest 2 View  Scalp lesion  Contact with or exposure to communicable disease - Plan: RPR, HIV antibody  Rectal bleeding The x-ray is negative and I will therefore put him on another course of the antibiotic. He is to return here for excision of the lesion when convenient. Discussed the rectal bleeding with him and indicating that it most likely is a rectal fissure and if he has this again, come in for more definitive evaluation.

## 2016-08-20 LAB — HIV ANTIBODY (ROUTINE TESTING W REFLEX): HIV: NONREACTIVE

## 2016-08-24 LAB — RPR

## 2016-10-23 IMAGING — CR DG CHEST 2V
2 series · 2 of 2 positions shown · non-contrast
Comparison: None.

CLINICAL DATA: 44-year-old male with palpitations x2 weeks.
Midsternal chest pain.

EXAM:
CHEST  2 VIEW

[w chest pa]
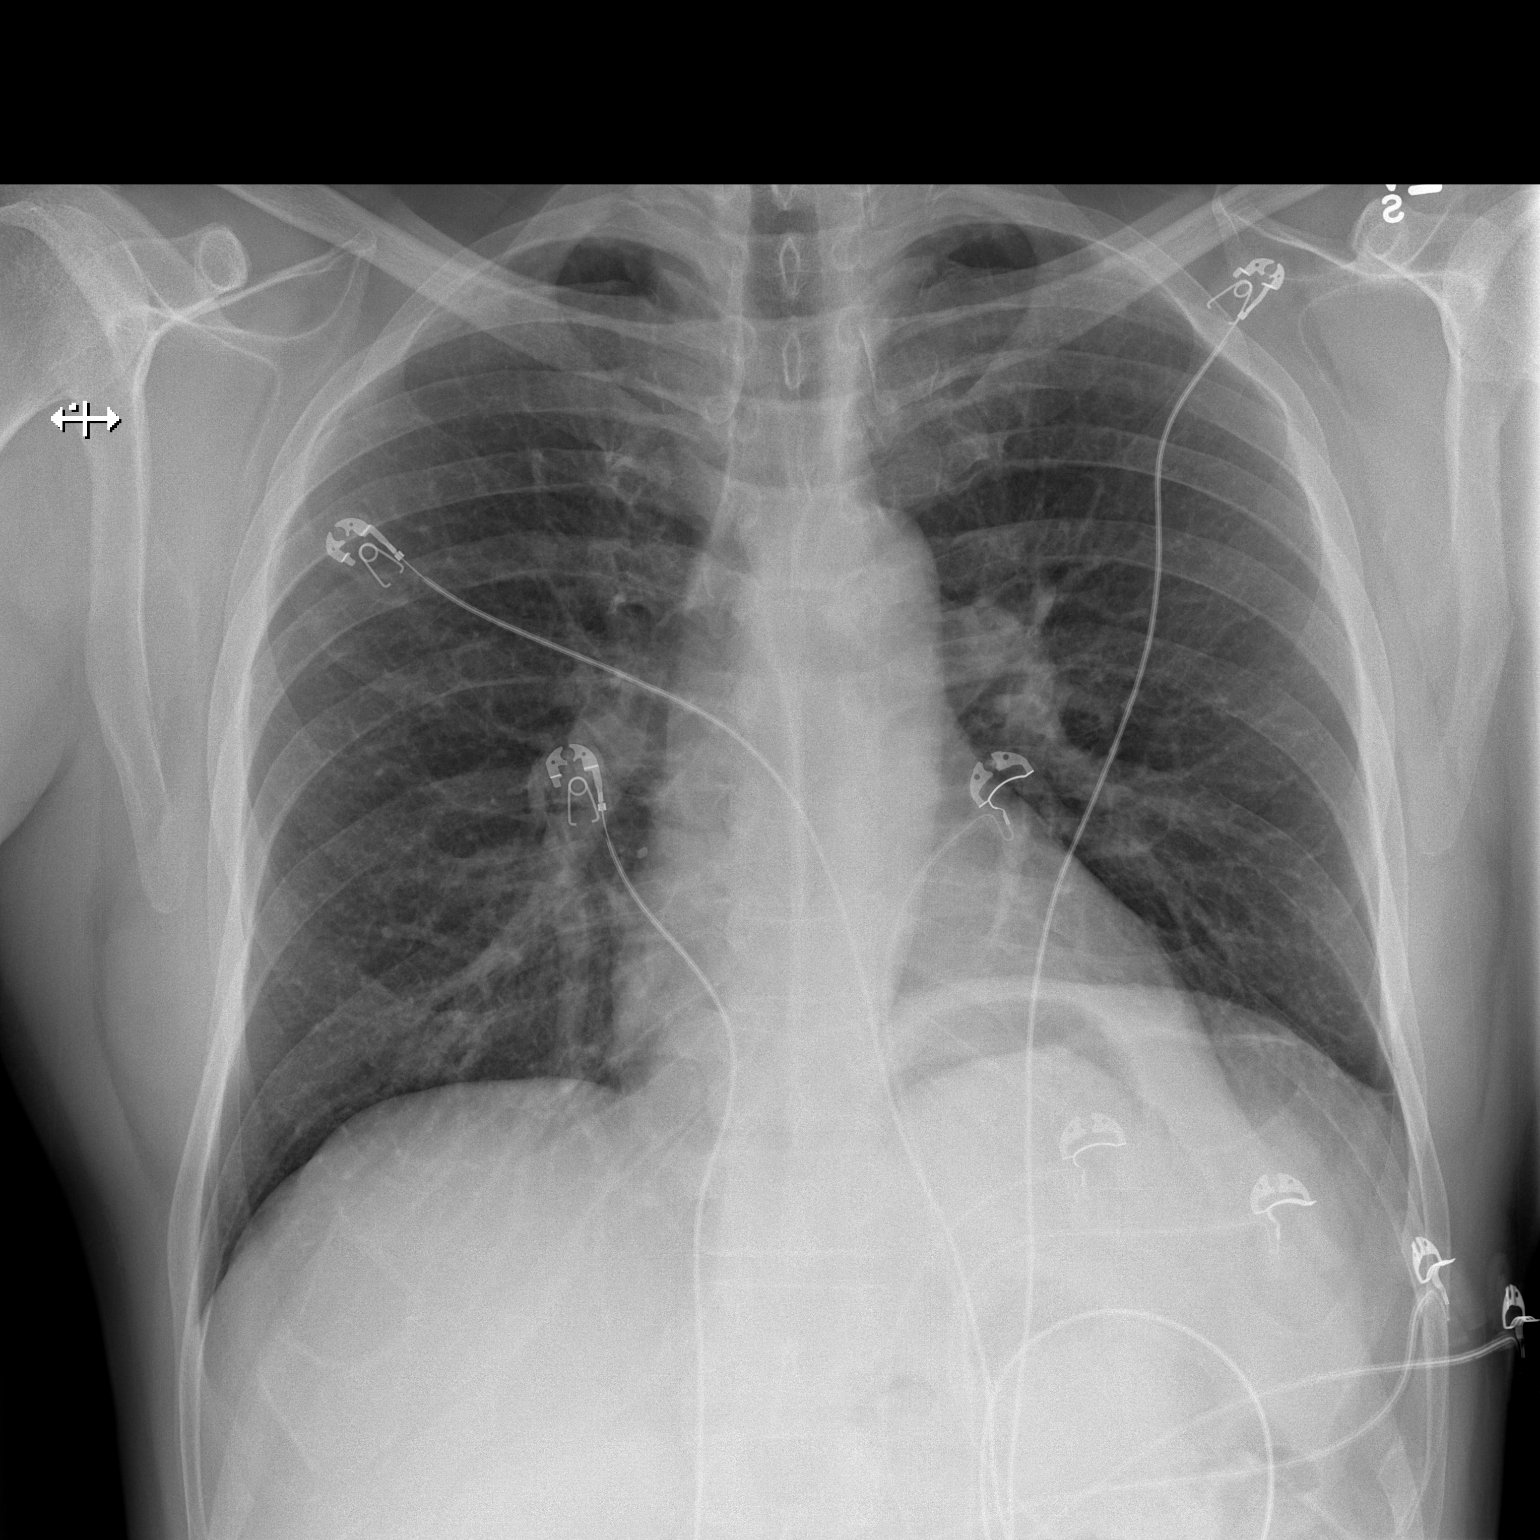

[w chest lat]
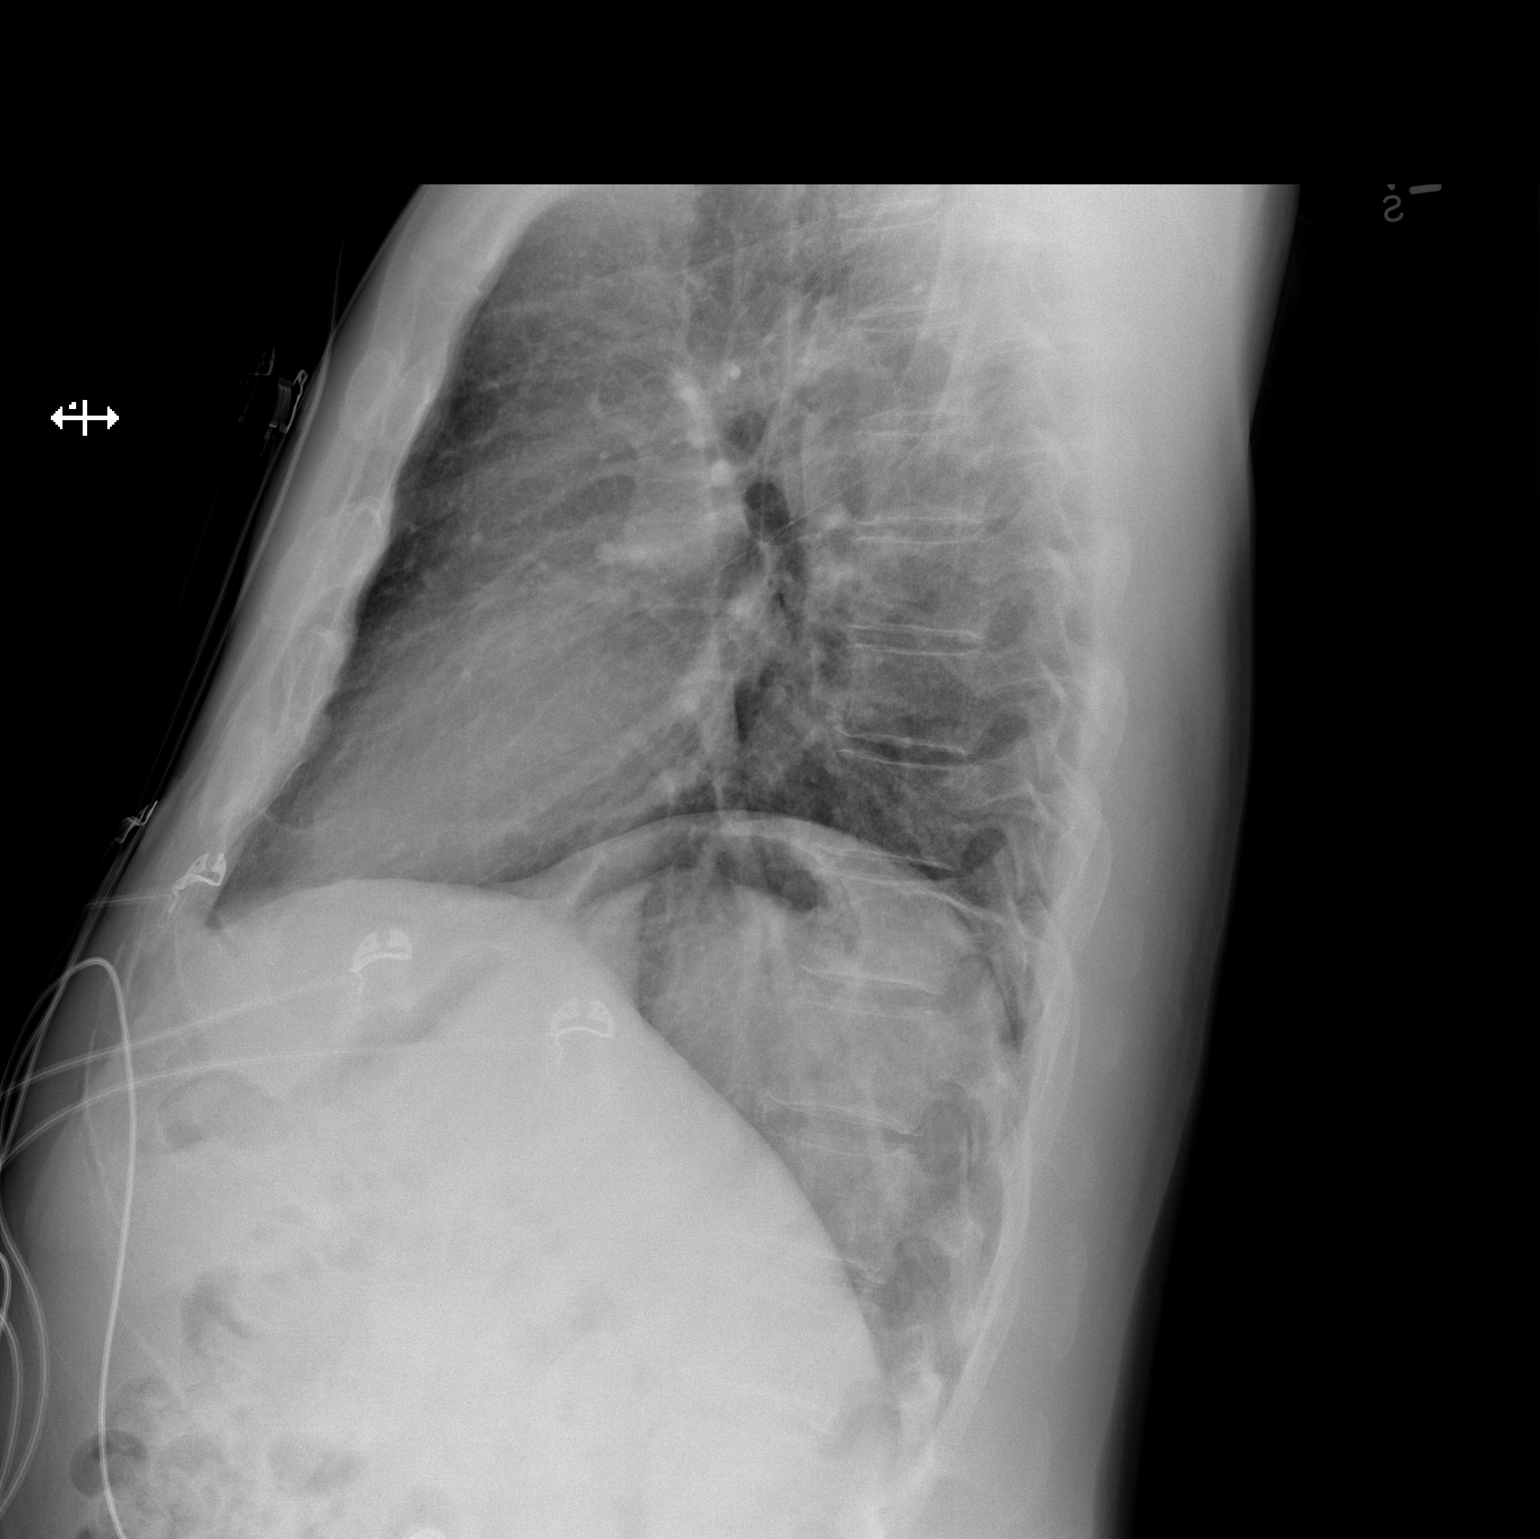

[2 of 2 positions shown; findings below may reference images not displayed]

FINDINGS: Two views of the chest demonstrate clear lungs with sharp
costophrenic angles. There is mild eventration of the left
hemidiaphragm. The cardiac silhouette is within normal limits. The
osseous structures are grossly unremarkable.
IMPRESSION: No active cardiopulmonary disease.

## 2017-04-19 ENCOUNTER — Ambulatory Visit: Payer: Managed Care, Other (non HMO) | Admitting: Family Medicine

## 2017-04-19 VITALS — BP 140/90 | HR 139 | Temp 98.3°F | Resp 18 | Wt 214.4 lb

## 2017-04-19 DIAGNOSIS — K529 Noninfective gastroenteritis and colitis, unspecified: Secondary | ICD-10-CM | POA: Diagnosis not present

## 2017-04-19 DIAGNOSIS — I456 Pre-excitation syndrome: Secondary | ICD-10-CM | POA: Diagnosis not present

## 2017-04-19 NOTE — Patient Instructions (Signed)
Keep yourself hydrated with whatever fluids you want.  You can use Tylenol but I also recommend 4 Advil 3 times per day for the aches and pains.  Eat whatever you want to.  Imodium for diarrhea

## 2017-04-19 NOTE — Progress Notes (Signed)
   Subjective:    Patient ID: Adam Glover, male    DOB: 08/15/1970, 47 y.o.   MRN: [id number]  HPI He is here for a consult concerning difficulty last night started with abdominal bloating followed by abdominal pain, diarrhea, myalgias, arthralgias, diarrhea and 2 episodes of vomiting.  He also has a previous history of diverticulitis. 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  He was seen by cardiology and was planning to get a second opinion concerning further ablation but has not done this yet.   Review of Systems     Objective:   Physical Exam Alert and in no distress. Tympanic membranes and canals are normal. Pharyngeal area is normal. Neck is supple without adenopathy or thyromegaly. Cardiac exam shows a tachycardia without murmurs or gallops. Lungs are clear to auscultation.  Abdominal exam shows decreased bowel sounds without masses or tenderness.        Assessment & Plan:  Acute gastroenteritis  WPW syndrome Recommended supportive care with fluids, Imodium, Tylenol and NSAID of choice for the aches and pains.  Encouraged him to get a second cardiac opinion.  [facility] that did the original ablation.

## 2017-04-24 ENCOUNTER — Other Ambulatory Visit: Payer: Self-pay | Admitting: Family Medicine

## 2017-04-24 DIAGNOSIS — R002 Palpitations: Secondary | ICD-10-CM

## 2017-04-25 ENCOUNTER — Telehealth: Payer: Self-pay

## 2017-04-25 ENCOUNTER — Telehealth: Payer: Self-pay | Admitting: Family Medicine

## 2017-04-25 NOTE — Telephone Encounter (Signed)
Called pt to let him know that he needed to call the office to make an appt or CPE. Left voice mail. Thanks [employer]

## 2017-04-25 NOTE — Telephone Encounter (Signed)
Pt made a cpe for April the the 4th if we could refill his lopressor until he comes in for his visit

## 2017-04-26 NOTE — Telephone Encounter (Signed)
Pt was called yesterday and med was sent in yesterday. Thanks [employer]

## 2017-05-03 ENCOUNTER — Other Ambulatory Visit: Payer: Self-pay | Admitting: Family Medicine

## 2017-05-03 DIAGNOSIS — R002 Palpitations: Secondary | ICD-10-CM

## 2017-06-09 ENCOUNTER — Ambulatory Visit (INDEPENDENT_AMBULATORY_CARE_PROVIDER_SITE_OTHER): Payer: Managed Care, Other (non HMO) | Admitting: Family Medicine

## 2017-06-09 ENCOUNTER — Encounter: Payer: Self-pay | Admitting: Family Medicine

## 2017-06-09 VITALS — BP 124/86 | HR 77 | Temp 98.0°F | Ht 74.0 in | Wt 208.2 lb

## 2017-06-09 DIAGNOSIS — Z0000 Encounter for general adult medical examination without abnormal findings: Secondary | ICD-10-CM

## 2017-06-09 DIAGNOSIS — H9313 Tinnitus, bilateral: Secondary | ICD-10-CM | POA: Diagnosis not present

## 2017-06-09 DIAGNOSIS — R002 Palpitations: Secondary | ICD-10-CM

## 2017-06-09 DIAGNOSIS — B001 Herpesviral vesicular dermatitis: Secondary | ICD-10-CM | POA: Diagnosis not present

## 2017-06-09 DIAGNOSIS — E781 Pure hyperglyceridemia: Secondary | ICD-10-CM

## 2017-06-09 DIAGNOSIS — L821 Other seborrheic keratosis: Secondary | ICD-10-CM

## 2017-06-09 DIAGNOSIS — I456 Pre-excitation syndrome: Secondary | ICD-10-CM | POA: Diagnosis not present

## 2017-06-09 DIAGNOSIS — Z209 Contact with and (suspected) exposure to unspecified communicable disease: Secondary | ICD-10-CM

## 2017-06-09 LAB — POCT URINALYSIS DIP (PROADVANTAGE DEVICE)
BILIRUBIN UA: NEGATIVE mg/dL
Bilirubin, UA: NEGATIVE
GLUCOSE UA: NEGATIVE mg/dL
Leukocytes, UA: NEGATIVE
Nitrite, UA: NEGATIVE
Protein Ur, POC: NEGATIVE mg/dL
RBC UA: NEGATIVE
Specific Gravity, Urine: 1.015
UUROB: 3.5
pH, UA: 6 (ref 5.0–8.0)

## 2017-06-09 MED ORDER — METOPROLOL TARTRATE 25 MG PO TABS: 25.0000 mg | ORAL_TABLET | Freq: Two times a day (BID) | ORAL | 3 refills | Status: DC

## 2017-06-09 NOTE — Progress Notes (Signed)
Subjective:    Patient ID: Adam Glover, male    DOB: 01/20/1971, 47 y.o.   MRN: [id number]  HPI He is here for complete examination.  He does have underlying WPW.  He was seen by cardiology concerning this however he has concerns about the possibility of having a pacer.  He wonders whether he can continue to do the things that he is doing now if he has a pacemaker.  He wants to stay physically active and at present is not as active as he would like to be.  He continues on metoprolol.  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  Does have a history of hypertriglyceridemia.  He is not currently sexually active but would like to be tested to be safe.  His last sexual contact was approximately 2 years ago.Review of the record indicates he did get a hepatitis B shot here but got 2 previous ones while he was in college. Family and social history as well as health maintenance and immunizations was reviewed   Review of Systems  All other systems reviewed and are negative.      Objective:   Physical Exam BP 124/86 (BP Location: Left Arm, Patient Position: Sitting)   Pulse 77   Temp 98 F (36.7 C)   Ht 6\' 2"  (1.88 m)   Wt 208 lb 3.2 oz (94.4 kg)   SpO2 98%   BMI 26.73 kg/m   General Appearance:    Alert, cooperative, no distress, appears stated age  Head:    Normocephalic, without obvious abnormality, atraumatic  Eyes:    PERRL, conjunctiva/corneas clear, EOM's intact, fundi    benign  Ears:    Normal TM's and external ear canals  Nose:   Nares normal, mucosa normal, no drainage or sinus   tenderness  Throat:   Lips, mucosa, and tongue normal; teeth and gums normal  Neck:   Supple, no lymphadenopathy;  thyroid:  no   enlargement/tenderness/nodules; no carotid   bruit or JVD     Lungs:     Clear to auscultation bilaterally without wheezes, rales or     ronchi; respirations unlabored      Heart:    Regular rate and rhythm, S1 and S2 normal, no murmur, rub   or  gallop     Abdomen:     Soft, non-tender, nondistended, normoactive bowel sounds,    no masses, no hepatosplenomegaly  Genitalia:    Normal male external genitalia without lesions.  Testicles without masses.  No inguinal hernias.  Rectal:   Deferred.  Guaiac cards given.  Extremities:   No clubbing, cyanosis or edema  Pulses:   2+ and symmetric all extremities  Skin:   Skin color, texture, turgor normal, no rashes or lesions  Lymph nodes:   Cervical, supraclavicular, and axillary nodes normal  Neurologic:   CNII-XII intact, normal strength, sensation and gait; reflexes 2+ and symmetric throughout          Psych:   Normal mood, affect, hygiene and grooming.         Assessment & Plan:  Routine general medical examination at a health care facility - Plan: CBC with Differential/Platelet, Comprehensive metabolic panel, Lipid panel  WPW syndrome - Plan: CBC with Differential/Platelet, Comprehensive metabolic panel, Lipid panel, Ambulatory referral to Cardiology  Contact with or exposure to communicable disease - Plan: RPR+HIV+GC+CT Panel  Herpes labialis  Hypertriglyceridemia - Plan: Lipid panel I discussed the WPW diagnosis with him and  further evaluation and treatment.  Did convince him to see Dr. Ladona Ridgel again and make sure that he gets all of his questions answered concerning the possibility of a pacemaker.  Follow-up on blood work when it comes in.

## 2017-06-09 NOTE — Addendum Note (Signed)
Addended by: Renelda LomaHENRY, Leba Tibbitts on: 06/09/2017 04:58 PM   Modules accepted: Orders

## 2017-06-10 LAB — CBC WITH DIFFERENTIAL/PLATELET
BASOS: 1 %
Basophils Absolute: 0 10*3/uL (ref 0.0–0.2)
EOS (ABSOLUTE): 0.1 10*3/uL (ref 0.0–0.4)
Eos: 1 %
HEMATOCRIT: 43.3 % (ref 37.5–51.0)
HEMOGLOBIN: 14.9 g/dL (ref 13.0–17.7)
Immature Grans (Abs): 0 10*3/uL (ref 0.0–0.1)
Immature Granulocytes: 0 %
LYMPHS ABS: 3.3 10*3/uL — AB (ref 0.7–3.1)
Lymphs: 51 %
MCH: 33.3 pg — AB (ref 26.6–33.0)
MCHC: 34.4 g/dL (ref 31.5–35.7)
MCV: 97 fL (ref 79–97)
MONOS ABS: 0.5 10*3/uL (ref 0.1–0.9)
Monocytes: 9 %
NEUTROS ABS: 2.4 10*3/uL (ref 1.4–7.0)
Neutrophils: 38 %
Platelets: 175 10*3/uL (ref 150–379)
RBC: 4.48 x10E6/uL (ref 4.14–5.80)
RDW: 13.3 % (ref 12.3–15.4)
WBC: 6.3 10*3/uL (ref 3.4–10.8)

## 2017-06-10 LAB — COMPREHENSIVE METABOLIC PANEL
A/G RATIO: 1.7 (ref 1.2–2.2)
ALBUMIN: 4.5 g/dL (ref 3.5–5.5)
ALK PHOS: 105 IU/L (ref 39–117)
ALT: 26 IU/L (ref 0–44)
AST: 22 IU/L (ref 0–40)
BUN / CREAT RATIO: 15 (ref 9–20)
BUN: 16 mg/dL (ref 6–24)
Bilirubin Total: 0.8 mg/dL (ref 0.0–1.2)
CO2: 25 mmol/L (ref 20–29)
Calcium: 9.6 mg/dL (ref 8.7–10.2)
Chloride: 98 mmol/L (ref 96–106)
Creatinine, Ser: 1.06 mg/dL (ref 0.76–1.27)
GFR calc Af Amer: 96 mL/min/{1.73_m2} (ref >59)
GFR calc non Af Amer: 83 mL/min/{1.73_m2} (ref >59)
Globulin, Total: 2.7 g/dL (ref 1.5–4.5)
Glucose: 89 mg/dL (ref 65–99)
POTASSIUM: 4 mmol/L (ref 3.5–5.2)
Sodium: 137 mmol/L (ref 134–144)
Total Protein: 7.2 g/dL (ref 6.0–8.5)

## 2017-06-10 LAB — LIPID PANEL
CHOL/HDL RATIO: 5.4 ratio — AB (ref 0.0–5.0)
CHOLESTEROL TOTAL: 201 mg/dL — AB (ref 100–199)
HDL: 37 mg/dL — ABNORMAL LOW (ref >39)
LDL CALC: 126 mg/dL — AB (ref 0–99)
TRIGLYCERIDES: 190 mg/dL — AB (ref 0–149)
VLDL Cholesterol Cal: 38 mg/dL (ref 5–40)

## 2017-06-10 LAB — RPR+HIV+GC+CT PANEL
CHLAMYDIA, DNA PROBE: NEGATIVE
HIV SCREEN 4TH GENERATION: NONREACTIVE
Neisseria gonorrhoeae by PCR: NEGATIVE
RPR: NONREACTIVE

## 2017-06-17 ENCOUNTER — Encounter: Payer: Self-pay | Admitting: Internal Medicine

## 2017-06-22 ENCOUNTER — Ambulatory Visit: Payer: Managed Care, Other (non HMO) | Admitting: Internal Medicine

## 2017-06-22 ENCOUNTER — Encounter: Payer: Self-pay | Admitting: Internal Medicine

## 2017-06-22 VITALS — BP 128/90 | HR 78 | Ht 75.0 in | Wt 211.0 lb

## 2017-06-22 DIAGNOSIS — I456 Pre-excitation syndrome: Secondary | ICD-10-CM

## 2017-06-22 NOTE — Patient Instructions (Addendum)
Medication Instructions:  Your physician recommends that you continue on your current medications as directed. Please refer to the Current Medication list given to you today.  Labwork: You will get lab work on June 07, 2017 any time.  CBC and BMP.  Testing/Procedures: Your physician has recommended that you have an ablation. Catheter ablation is a medical procedure used to treat some cardiac arrhythmias (irregular heartbeats). During catheter ablation, a long, thin, flexible tube is put into a blood vessel in your groin (upper thigh), or neck. This tube is called an ablation catheter. It is then guided to your heart through the blood vessel. Radio frequency waves destroy small areas of heart tissue where abnormal heartbeats may cause an arrhythmia to start. Please see the instruction sheet given to you today.  Follow-Up:  You will follow up with Dr. Ladona Ridgel 4 weeks after your procedure.  Any Other Special Instructions Will Be Listed Below (If Applicable).  Please arrive at the [facility] main entrance of [place] [facility] at:  5:30 am on Jul 11, 2017 Do not eat or drink after midnight prior to procedure Do not take your METOPROLOL for 2 days prior to your procedure Plan for one night stay-but you may go home same day You will need someone to drive you home at discharge  If you need a refill on your cardiac medications before your next appointment, please call your pharmacy.    Cardiac Ablation Cardiac ablation is a procedure to disable (ablate) a small amount of heart tissue in very specific places. The heart has many electrical connections. Sometimes these connections are abnormal and can cause the heart to beat very fast or irregularly. Ablating some of the problem areas can improve the heart rhythm or return it to normal. Ablation may be done for people who:  Have Wolff-Parkinson-White syndrome.  Have fast heart rhythms (tachycardia).  Have taken medicines for an abnormal heart  rhythm (arrhythmia) that were not effective or caused side effects.  Have a high-risk heartbeat that may be life-threatening.  During the procedure, a small incision is made in the neck or the groin, and a long, thin, flexible tube (catheter) is inserted into the incision and moved to the heart. Small devices (electrodes) on the tip of the catheter will send out electrical currents. A type of X-ray (fluoroscopy) will be used to help guide the catheter and to provide images of the heart. Tell a health care provider about:  Any allergies you have.  All medicines you are taking, including vitamins, herbs, eye drops, creams, and over-the-counter medicines.  Any problems you or family members have had with anesthetic medicines.  Any blood disorders you have.  Any surgeries you have had.  Any medical conditions you have, such as kidney failure.  Whether you are pregnant or may be pregnant. What are the risks? Generally, this is a safe procedure. However, problems may occur, including:  Infection.  Bruising and bleeding at the catheter insertion site.  Bleeding into the chest, especially into the sac that surrounds the heart. This is a serious complication.  Stroke or blood clots.  Damage to other structures or organs.  Allergic reaction to medicines or dyes.  Need for a permanent pacemaker if the normal electrical system is damaged. A pacemaker is a small computer that sends electrical signals to the heart and helps your heart beat normally.  The procedure not being fully effective. This may not be recognized until months later. Repeat ablation procedures are sometimes required.  What happens before the procedure?  Follow instructions from your health care provider about eating or drinking restrictions.  Ask your health care provider about: ? Changing or stopping your regular medicines. This is especially important if you are taking diabetes medicines or blood  thinners. ? Taking medicines such as aspirin and ibuprofen. These medicines can thin your blood. Do not take these medicines before your procedure if your health care provider instructs you not to.  Plan to have someone take you home from the hospital or clinic.  If you will be going home right after the procedure, plan to have someone with you for 24 hours. What happens during the procedure?  To lower your risk of infection: ? Your health care team will wash or sanitize their hands. ? Your skin will be washed with soap. ? Hair may be removed from the incision area.  An IV tube will be inserted into one of your veins.  You will be given a medicine to help you relax (sedative).  The skin on your neck or groin will be numbed.  An incision will be made in your neck or your groin.  A needle will be inserted through the incision and into a large vein in your neck or groin.  A catheter will be inserted into the needle and moved to your heart.  Dye may be injected through the catheter to help your surgeon see the area of the heart that needs treatment.  Electrical currents will be sent from the catheter to ablate heart tissue in desired areas. There are three types of energy that may be used to ablate heart tissue: ? Heat (radiofrequency energy). ? Laser energy. ? Extreme cold (cryoablation).  When the necessary tissue has been ablated, the catheter will be removed.  Pressure will be held on the catheter insertion area to prevent excessive bleeding.  A bandage (dressing) will be placed over the catheter insertion area. The procedure may vary among health care providers and hospitals. What happens after the procedure?  Your blood pressure, heart rate, breathing rate, and blood oxygen level will be monitored until the medicines you were given have worn off.  Your catheter insertion area will be monitored for bleeding. You will need to lie still for a few hours to ensure that you do  not bleed from the catheter insertion area.  Do not drive for 24 hours or as long as directed by your health care provider. Summary  Cardiac ablation is a procedure to disable (ablate) a small amount of heart tissue in very specific places. Ablating some of the problem areas can improve the heart rhythm or return it to normal.  During the procedure, electrical currents will be sent from the catheter to ablate heart tissue in desired areas. This information is not intended to replace advice given to you by your health care provider. Make sure you discuss any questions you have with your health care provider. Document Released: 07/11/2008 Document Revised: 01/12/2016 Document Reviewed: 01/12/2016 Elsevier Interactive Patient Education  [employer]2018 [employer]vier Inc.

## 2017-06-22 NOTE — H&P (View-Only) (Signed)
HPI [name] returns today after a 2-year absence from our arrhythmia clinic.  He is a very pleasant 47 year old man with Wolff-Parkinson-White syndrome, who underwent catheter ablation almost 30 years ago.  It was a concern at the time that his normal AV node may have been damaged.  Over the years, he has had recurrent tachypalpitations.  He has baseline manifest WPW with fairly marked ventricular preexcitation.  Patient's twelve-lead EKG demonstrates a strongly positive delta wave in 1 and aVL, negative delta wave in aVR, and transition from negative to positive at lead V3.  His actual delta wave turns positive at V4.  The patient is anxious but continues to have palpitations.  He has not had syncope.  On beta-blocker therapy, he has not had sustained tachypalpitations although he had previously experienced these. Allergies  Allergen Reactions  . Ilosone [Erythromycin Estolate] Diarrhea    blisters     Current Outpatient Medications  Medication Sig Dispense Refill  . Ascorbic Acid (SM CHEWABLE VITAMIN C) 500 MG CHEW Chew 1 tablet by mouth.    . cholecalciferol (VITAMIN D) 1000 units tablet Take 2,000 Units by mouth daily.     . metoprolol tartrate (LOPRESSOR) 25 MG tablet Take 1 tablet (25 mg total) by mouth 2 (two) times daily. 180 tablet 3  . valACYclovir (VALTREX) 1000 MG tablet TAKE 2 TABS 2 TIMES A DAY FOR 1 DAY (Patient taking differently: TAKE 2 TABS 2 TIMES A DAY FOR 1 DAY as needed) 20 tablet 1  . Zinc 30 MG TABS Take 1 tablet by mouth.     No current facility-administered medications for this visit.      Past Medical History:  Diagnosis Date  . Anal fissure   . Diverticulosis of colon    hx/o diverticulitis x 1  . Herpes labialis   . Hypertension   . Hypertriglyceridemia   . IBS (irritable bowel syndrome)   . Seborrheic dermatitis   . Tinnitus   . Vitamin D deficiency   . WPW (Wolff-Parkinson-White syndrome) 1990   ablation    ROS:   All systems  reviewed and negative except as noted in the HPI.   Past Surgical History:  Procedure Laterality Date  . CARDIAC ELECTROPHYSIOLOGY MAPPING AND ABLATION  1990   hx/o WPW  . rectal condaloma procedure       Family History  Problem Relation Age of Onset  . Breast cancer Maternal Grandmother   . Hypertension Maternal Grandmother   . Dementia Maternal Grandmother   . Heart disease Maternal Grandmother   . Cancer Paternal Grandmother   . Diabetes Paternal Grandmother   . Stroke Paternal Grandmother   . Heart murmur Paternal Grandmother   . Pancreatic cancer Paternal Grandfather        questionable  . Breast cancer Mother   . Diabetes Mother   . Colon polyps Mother   . Prostate cancer Father 470  . Seizures Father   . Heart disease Father        BBB  . Vitamin D deficiency Father   . Other Father        bundle branch block  . Vitamin D deficiency Brother   . Diabetes Brother   . Other Maternal Grandfather        mucoid cancer  . Colon polyps Brother      Social History   Socioeconomic History  . Marital status: Single    Spouse name: Not on file  . Number of  children: Not on file  . Years of education: Not on file  . Highest education level: Not on file  Occupational History  . Not on file  Social Needs  . Financial resource strain: Not on file  . Food insecurity:    Worry: Not on file    Inability: Not on file  . Transportation needs:    Medical: Not on file    Non-medical: Not on file  Tobacco Use  . Smoking status: Never Smoker  . Smokeless tobacco: Never Used  Substance and Sexual Activity  . Alcohol use: No    Alcohol/week: 0.0 oz  . Drug use: No  . Sexual activity: Not Currently    Birth control/protection: Condom  Lifestyle  . Physical activity:    Days per week: Not on file    Minutes per session: Not on file  . Stress: Not on file  Relationships  . Social connections:    Talks on phone: Not on file    Gets together: Not on file    Attends  religious service: Not on file    Active member of club or organization: Not on file    Attends meetings of clubs or organizations: Not on file    Relationship status: Not on file  . Intimate partner violence:    Fear of current or ex partner: Not on file    Emotionally abused: Not on file    Physically abused: Not on file    Forced sexual activity: Not on file  Other Topics Concern  . Not on file  Social History Narrative   In relationship with another male x 1+ year, but hx/o multiple sexual partners, not exercising     BP 128/90   Pulse 78   Ht 6\' 3"  (1.905 m)   Wt 211 lb (95.7 kg)   BMI 26.37 kg/m   Physical Exam:  Well appearing 47 year old man, NAD HEENT: Unremarkable Neck: 6 cm JVD, no thyromegally Lymphatics:  No adenopathy Back:  No CVA tenderness Lungs:  Clear, with no wheezes, rales, or rhonchi HEART:  Regular rate rhythm, no murmurs, no rubs, no clicks Abd:  soft, positive bowel sounds, no organomegally, no rebound, no guarding Ext:  2 plus pulses, no edema, no cyanosis, no clubbing Skin:  No rashes no nodules Neuro:  CN II through XII intact, motor grossly intact  EKG -normal sinus rhythm with marked ventricular preexcitation   Assess/Plan: 1.  WPW syndrome -today we discussed the multitude of treatment options with the patient.  The possibility that he has no underlying AV nodal function has been brought up and discussed.  Specifically, if he has his accessory pathway ablated, he may have no conduction from atrium to ventricle.  This would require permanent pacemaker which I have reviewed in detail with him.  He may have 2 accessory pathways.  In addition, the patient could develop complete heart block if and when his pathway conduction goes away, and could also develop rapid atrial fibrillation going over his pathway leading to sudden cardiac death.  After review of all the possible options, I have recommended proceeding with EP study and catheter ablation.  We  will try to get rid of his accessory pathway without causing damage to his AV conduction system.  I would anticipate trying to lightly bumped the pathway to assess his AV conduction. 2.  Palpitations - he has known PACs and PVCs. I have discussed the possibility that his symptoms may not go away after ablation.  I spent over 25 minutes including 50% face to face time discussing the patient''s condition and formulating a plan.  Leonia Reeves.D

## 2017-06-22 NOTE — Progress Notes (Addendum)
HPI [name] returns today after a 2-year absence from our arrhythmia clinic.  He is a very pleasant 47 year old man with Wolff-Parkinson-White syndrome, who underwent catheter ablation almost 30 years ago.  It was a concern at the time that his normal AV node may have been damaged.  Over the years, he has had recurrent tachypalpitations.  He has baseline manifest WPW with fairly marked ventricular preexcitation.  Patient's twelve-lead EKG demonstrates a strongly positive delta wave in 1 and aVL, negative delta wave in aVR, and transition from negative to positive at lead V3.  His actual delta wave turns positive at V4.  The patient is anxious but continues to have palpitations.  He has not had syncope.  On beta-blocker therapy, he has not had sustained tachypalpitations although he had previously experienced these. Allergies  Allergen Reactions  . Ilosone [Erythromycin Estolate] Diarrhea    blisters     Current Outpatient Medications  Medication Sig Dispense Refill  . Ascorbic Acid (SM CHEWABLE VITAMIN C) 500 MG CHEW Chew 1 tablet by mouth.    . cholecalciferol (VITAMIN D) 1000 units tablet Take 2,000 Units by mouth daily.     . metoprolol tartrate (LOPRESSOR) 25 MG tablet Take 1 tablet (25 mg total) by mouth 2 (two) times daily. 180 tablet 3  . valACYclovir (VALTREX) 1000 MG tablet TAKE 2 TABS 2 TIMES A DAY FOR 1 DAY (Patient taking differently: TAKE 2 TABS 2 TIMES A DAY FOR 1 DAY as needed) 20 tablet 1  . Zinc 30 MG TABS Take 1 tablet by mouth.     No current facility-administered medications for this visit.      Past Medical History:  Diagnosis Date  . Anal fissure   . Diverticulosis of colon    hx/o diverticulitis x 1  . Herpes labialis   . Hypertension   . Hypertriglyceridemia   . IBS (irritable bowel syndrome)   . Seborrheic dermatitis   . Tinnitus   . Vitamin D deficiency   . WPW (Wolff-Parkinson-White syndrome) 1990   ablation    ROS:   All systems  reviewed and negative except as noted in the HPI.   Past Surgical History:  Procedure Laterality Date  . CARDIAC ELECTROPHYSIOLOGY MAPPING AND ABLATION  1990   hx/o WPW  . rectal condaloma procedure       Family History  Problem Relation Age of Onset  . Breast cancer Maternal Grandmother   . Hypertension Maternal Grandmother   . Dementia Maternal Grandmother   . Heart disease Maternal Grandmother   . Cancer Paternal Grandmother   . Diabetes Paternal Grandmother   . Stroke Paternal Grandmother   . Heart murmur Paternal Grandmother   . Pancreatic cancer Paternal Grandfather        questionable  . Breast cancer Mother   . Diabetes Mother   . Colon polyps Mother   . Prostate cancer Father 470  . Seizures Father   . Heart disease Father        BBB  . Vitamin D deficiency Father   . Other Father        bundle branch block  . Vitamin D deficiency Brother   . Diabetes Brother   . Other Maternal Grandfather        mucoid cancer  . Colon polyps Brother      Social History   Socioeconomic History  . Marital status: Single    Spouse name: Not on file  . Number of  children: Not on file  . Years of education: Not on file  . Highest education level: Not on file  Occupational History  . Not on file  Social Needs  . Financial resource strain: Not on file  . Food insecurity:    Worry: Not on file    Inability: Not on file  . Transportation needs:    Medical: Not on file    Non-medical: Not on file  Tobacco Use  . Smoking status: Never Smoker  . Smokeless tobacco: Never Used  Substance and Sexual Activity  . Alcohol use: No    Alcohol/week: 0.0 oz  . Drug use: No  . Sexual activity: Not Currently    Birth control/protection: Condom  Lifestyle  . Physical activity:    Days per week: Not on file    Minutes per session: Not on file  . Stress: Not on file  Relationships  . Social connections:    Talks on phone: Not on file    Gets together: Not on file    Attends  religious service: Not on file    Active member of club or organization: Not on file    Attends meetings of clubs or organizations: Not on file    Relationship status: Not on file  . Intimate partner violence:    Fear of current or ex partner: Not on file    Emotionally abused: Not on file    Physically abused: Not on file    Forced sexual activity: Not on file  Other Topics Concern  . Not on file  Social History Narrative   In relationship with another male x 1+ year, but hx/o multiple sexual partners, not exercising     BP 128/90   Pulse 78   Ht 6\' 3"  (1.905 m)   Wt 211 lb (95.7 kg)   BMI 26.37 kg/m   Physical Exam:  Well appearing 47 year old man, NAD HEENT: Unremarkable Neck: 6 cm JVD, no thyromegally Lymphatics:  No adenopathy Back:  No CVA tenderness Lungs:  Clear, with no wheezes, rales, or rhonchi HEART:  Regular rate rhythm, no murmurs, no rubs, no clicks Abd:  soft, positive bowel sounds, no organomegally, no rebound, no guarding Ext:  2 plus pulses, no edema, no cyanosis, no clubbing Skin:  No rashes no nodules Neuro:  CN II through XII intact, motor grossly intact  EKG -normal sinus rhythm with marked ventricular preexcitation   Assess/Plan: 1.  WPW syndrome -today we discussed the multitude of treatment options with the patient.  The possibility that he has no underlying AV nodal function has been brought up and discussed.  Specifically, if he has his accessory pathway ablated, he may have no conduction from atrium to ventricle.  This would require permanent pacemaker which I have reviewed in detail with him.  He may have 2 accessory pathways.  In addition, the patient could develop complete heart block if and when his pathway conduction goes away, and could also develop rapid atrial fibrillation going over his pathway leading to sudden cardiac death.  After review of all the possible options, I have recommended proceeding with EP study and catheter ablation.  We  will try to get rid of his accessory pathway without causing damage to his AV conduction system.  I would anticipate trying to lightly bumped the pathway to assess his AV conduction. 2.  Palpitations - he has known PACs and PVCs. I have discussed the possibility that his symptoms may not go away after ablation.  I spent over 25 minutes including 50% face to face time discussing the patient''s condition and formulating a plan.  Leonia Reeves.D

## 2017-06-25 ENCOUNTER — Other Ambulatory Visit: Payer: Self-pay | Admitting: Family Medicine

## 2017-06-25 DIAGNOSIS — B001 Herpesviral vesicular dermatitis: Secondary | ICD-10-CM

## 2017-06-27 NOTE — Telephone Encounter (Signed)
[facility] is requesting to fill pt valtrex please advise. Thanks [email or web address]

## 2017-07-07 ENCOUNTER — Other Ambulatory Visit: Payer: Managed Care, Other (non HMO) | Admitting: *Deleted

## 2017-07-07 DIAGNOSIS — I456 Pre-excitation syndrome: Secondary | ICD-10-CM

## 2017-07-08 LAB — CBC WITH DIFFERENTIAL/PLATELET
BASOS: 1 %
Basophils Absolute: 0 10*3/uL (ref 0.0–0.2)
EOS (ABSOLUTE): 0.1 10*3/uL (ref 0.0–0.4)
EOS: 2 %
HEMATOCRIT: 43.4 % (ref 37.5–51.0)
Hemoglobin: 14.5 g/dL (ref 13.0–17.7)
IMMATURE GRANS (ABS): 0 10*3/uL (ref 0.0–0.1)
IMMATURE GRANULOCYTES: 0 %
Lymphocytes Absolute: 2.7 10*3/uL (ref 0.7–3.1)
Lymphs: 45 %
MCH: 33.5 pg — ABNORMAL HIGH (ref 26.6–33.0)
MCHC: 33.4 g/dL (ref 31.5–35.7)
MCV: 100 fL — AB (ref 79–97)
MONOCYTES: 8 %
MONOS ABS: 0.5 10*3/uL (ref 0.1–0.9)
NEUTROS PCT: 44 %
Neutrophils Absolute: 2.6 10*3/uL (ref 1.4–7.0)
Platelets: 162 10*3/uL (ref 150–379)
RBC: 4.33 x10E6/uL (ref 4.14–5.80)
RDW: 13.6 % (ref 12.3–15.4)
WBC: 5.9 10*3/uL (ref 3.4–10.8)

## 2017-07-08 LAB — BASIC METABOLIC PANEL
BUN/Creatinine Ratio: 20 (ref 9–20)
BUN: 15 mg/dL (ref 6–24)
CALCIUM: 9 mg/dL (ref 8.7–10.2)
CO2: 24 mmol/L (ref 20–29)
CREATININE: 0.75 mg/dL — AB (ref 0.76–1.27)
Chloride: 103 mmol/L (ref 96–106)
GFR, EST AFRICAN AMERICAN: 126 mL/min/{1.73_m2} (ref >59)
GFR, EST NON AFRICAN AMERICAN: 109 mL/min/{1.73_m2} (ref >59)
Glucose: 98 mg/dL (ref 65–99)
Potassium: 4.4 mmol/L (ref 3.5–5.2)
Sodium: 143 mmol/L (ref 134–144)

## 2017-07-11 ENCOUNTER — Other Ambulatory Visit: Payer: Managed Care, Other (non HMO)

## 2017-07-11 ENCOUNTER — Ambulatory Visit (HOSPITAL_COMMUNITY)
Admission: RE | Admit: 2017-07-11 | Discharge: 2017-07-11 | Disposition: A | Discharge status: Home / Self Care | Payer: Managed Care, Other (non HMO) | Source: Ambulatory Visit | Attending: Internal Medicine | Admitting: Internal Medicine

## 2017-07-11 ENCOUNTER — Ambulatory Visit (HOSPITAL_COMMUNITY)
Admission: RE | Disposition: A | Discharge status: Home / Self Care | Payer: Self-pay | Source: Ambulatory Visit | Attending: Internal Medicine

## 2017-07-11 DIAGNOSIS — I10 Essential (primary) hypertension: Secondary | ICD-10-CM | POA: Insufficient documentation

## 2017-07-11 DIAGNOSIS — I471 Supraventricular tachycardia: Secondary | ICD-10-CM | POA: Insufficient documentation

## 2017-07-11 DIAGNOSIS — Z79899 Other long term (current) drug therapy: Secondary | ICD-10-CM | POA: Insufficient documentation

## 2017-07-11 DIAGNOSIS — Z01818 Encounter for other preprocedural examination: Secondary | ICD-10-CM | POA: Insufficient documentation

## 2017-07-11 DIAGNOSIS — I456 Pre-excitation syndrome: Secondary | ICD-10-CM | POA: Insufficient documentation

## 2017-07-11 HISTORY — PX: SVT ABLATION: EP1225

## 2017-07-11 LAB — SURGICAL PCR SCREEN
MRSA, PCR: NEGATIVE
Staphylococcus aureus: NEGATIVE

## 2017-07-11 SURGERY — SVT ABLATION

## 2017-07-11 MED ORDER — SODIUM CHLORIDE 0.9% FLUSH: 3.0000 mL | Freq: Two times a day (BID) | INTRAVENOUS | Status: DC

## 2017-07-11 MED ORDER — SODIUM CHLORIDE 0.9 % IV SOLN
80.0000 mg | INTRAVENOUS | Status: DC
  Filled 2017-07-11: qty 2

## 2017-07-11 MED ORDER — CHLORHEXIDINE GLUCONATE 4 % EX LIQD
60.0000 mL | Freq: Once | CUTANEOUS | Status: DC
  Filled 2017-07-11: qty 60

## 2017-07-11 MED ORDER — SODIUM CHLORIDE 0.9 % IV SOLN: 250.0000 mL | INTRAVENOUS | Status: DC | PRN

## 2017-07-11 MED ORDER — SODIUM CHLORIDE 0.9% FLUSH: 3.0000 mL | INTRAVENOUS | Status: DC | PRN

## 2017-07-11 MED ORDER — MIDAZOLAM HCL 5 MG/5ML IJ SOLN
INTRAMUSCULAR | Status: DC | PRN
  Administered 2017-07-11: 2 mg via INTRAVENOUS
  Administered 2017-07-11 (×3): 1 mg via INTRAVENOUS
  Administered 2017-07-11 (×2): 2 mg via INTRAVENOUS
  Administered 2017-07-11: 1 mg via INTRAVENOUS

## 2017-07-11 MED ORDER — ONDANSETRON HCL 4 MG/2ML IJ SOLN: 4.0000 mg | Freq: Four times a day (QID) | INTRAMUSCULAR | Status: DC | PRN

## 2017-07-11 MED ORDER — BUPIVACAINE HCL (PF) 0.25 % IJ SOLN
INTRAMUSCULAR | Status: AC
  Filled 2017-07-11: qty 30

## 2017-07-11 MED ORDER — FENTANYL CITRATE (PF) 100 MCG/2ML IJ SOLN
INTRAMUSCULAR | Status: AC
  Filled 2017-07-11: qty 2

## 2017-07-11 MED ORDER — MIDAZOLAM HCL 5 MG/5ML IJ SOLN
INTRAMUSCULAR | Status: AC
  Filled 2017-07-11: qty 5

## 2017-07-11 MED ORDER — SODIUM CHLORIDE 0.9 % IV SOLN: INTRAVENOUS | Status: DC

## 2017-07-11 MED ORDER — FENTANYL CITRATE (PF) 100 MCG/2ML IJ SOLN
INTRAMUSCULAR | Status: DC | PRN
  Administered 2017-07-11 (×3): 25 ug via INTRAVENOUS
  Administered 2017-07-11 (×2): 12.5 ug via INTRAVENOUS
  Administered 2017-07-11: 25 ug via INTRAVENOUS

## 2017-07-11 MED ORDER — SODIUM CHLORIDE 0.9 % IV SOLN
INTRAVENOUS | Status: DC
  Administered 2017-07-11: 07:00:00 via INTRAVENOUS

## 2017-07-11 MED ORDER — MUPIROCIN 2 % EX OINT
TOPICAL_OINTMENT | CUTANEOUS | Status: AC
  Administered 2017-07-11: 1
  Filled 2017-07-11: qty 22

## 2017-07-11 MED ORDER — ACETAMINOPHEN 325 MG PO TABS
650.0000 mg | ORAL_TABLET | ORAL | Status: DC | PRN
  Filled 2017-07-11: qty 2

## 2017-07-11 MED ORDER — HEPARIN (PORCINE) IN NACL 1000-0.9 UT/500ML-% IV SOLN
INTRAVENOUS | Status: AC
  Filled 2017-07-11: qty 500

## 2017-07-11 MED ORDER — BUPIVACAINE HCL (PF) 0.25 % IJ SOLN
INTRAMUSCULAR | Status: DC | PRN
  Administered 2017-07-11: 45 mL

## 2017-07-11 MED ORDER — HEPARIN (PORCINE) IN NACL 2-0.9 UNITS/ML
INTRAMUSCULAR | Status: AC | PRN
  Administered 2017-07-11: 500 mL

## 2017-07-11 MED ORDER — CEFAZOLIN SODIUM-DEXTROSE 2-4 GM/100ML-% IV SOLN
2.0000 g | INTRAVENOUS | Status: DC
  Filled 2017-07-11: qty 100

## 2017-07-11 SURGICAL SUPPLY — 14 items
BAG SNAP BAND KOVER 36X36 (MISCELLANEOUS) ×2 IMPLANT
CATH EZ STEER NAV 4MM D-F CUR (ABLATOR) ×2 IMPLANT
CATH HEX JOS 2-5-2 65CM 6F REP (CATHETERS) ×2 IMPLANT
CATH HEX JOSEPH 2-5-2 65CM 6F (CATHETERS) ×2 IMPLANT
CATH JOSEPH QUAD ALLRED 6F REP (CATHETERS) ×4 IMPLANT
CATH POLARIS X 2.5/5/2.5 DECAP (CATHETERS) ×2 IMPLANT
PACK EP LATEX FREE (CUSTOM PROCEDURE TRAY) ×3
PACK EP LF (CUSTOM PROCEDURE TRAY) IMPLANT
PAD DEFIB LIFELINK (PAD) ×2 IMPLANT
PATCH CARTO3 (PAD) ×2 IMPLANT
SHEATH AVANTI 11CM 6FR (SHEATH) ×2 IMPLANT
SHEATH AVANTI 11CM 8FR (SHEATH) ×2 IMPLANT
SHEATH PINNACLE 7F 10CM (SHEATH) ×2 IMPLANT
SHIELD RADPAD SCOOP 12X17 (MISCELLANEOUS) ×2 IMPLANT

## 2017-07-11 NOTE — Discharge Instructions (Signed)
Excuse from Work, [employer], or Physical Activity _____________________________[name]__________________________ needs to be excused from: ____ Work ____ [employer] ____ Physical activity beginning now and through the following date: ________________. He or she may return to work or school but should still avoid the following physical activity or activities from now until __________5/14/2019______. Activity restrictions include: __X__ Lifting more than ___5____ lb ____ Sitting longer than __________ minutes at a time ____ Standing longer than ________ minutes at a time ____ He or she may return to full physical activity as of ________________. Health Care Provider Name (printed): __DR Sharlot Gowda TAYLOR______________________________________ Health Care Provider (signature): ___________________________________________ Date: __5-6-19______________ This information is not intended to replace advice given to you by your health care provider. Make sure you discuss any questions you have with your health care provider. Document Released: 08/18/2000 Document Revised: 02/06/2016 Document Reviewed: 09/24/2013 Elsevier Interactive Patient Education  2018 [employer]. Post procedure care instructions No driving for 4 days. No lifting over 5 lbs for 1 week. No vigorous or sexual activity for 1 week. You may return to work on 07/18/17. Keep procedure site clean & dry. If you notice increased pain, swelling, bleeding or pus, call/return!  You may shower, but no soaking baths/hot tubs/pools for 1 week.    Cardiac Ablation, Care After This sheet gives you information about how to care for yourself after your procedure. Your health care provider may also give you more specific instructions. If you have problems or questions, contact your health care provider. What can I expect after the procedure? After the procedure, it is common to have:  Bruising around your puncture site.  Tenderness around your puncture  site.  Skipped heartbeats.  Tiredness (fatigue).  Follow these instructions at home: Puncture site care  Follow instructions from your health care provider about how to take care of your puncture site. Make sure you: ? Wash your hands with soap and water before you change your bandage (dressing). If soap and water are not available, use hand sanitizer. ? Change your dressing as told by your health care provider. ? Leave stitches (sutures), skin glue, or adhesive strips in place. These skin closures may need to stay in place for up to 2 weeks. If adhesive strip edges start to loosen and curl up, you may trim the loose edges. Do not remove adhesive strips completely unless your health care provider tells you to do that.  Check your puncture site every day for signs of infection. Check for: ? Redness, swelling, or pain. ? Fluid or blood. If your puncture site starts to bleed, lie down on your back, apply firm pressure to the area, and contact your health care provider. ? Warmth. ? Pus or a bad smell. Driving  Ask your health care provider when it is safe for you to drive again after the procedure.  Do not drive or use heavy machinery while taking prescription pain medicine.  Do not drive for 24 hours if you were given a medicine to help you relax (sedative) during your procedure. Activity  Avoid activities that take a lot of effort for at least 3 days after your procedure.  Do not lift anything that is heavier than 10 lb (4.5 kg), or the limit that you are told, until your health care provider says that it is safe.  Return to your normal activities as told by your health care provider. Ask your health care provider what activities are safe for you. General instructions  Take over-the-counter and prescription medicines only as  told by your health care provider.  Do not use any products that contain nicotine or tobacco, such as cigarettes and e-cigarettes. If you need help quitting,  ask your health care provider.  Do not take baths, swim, or use a hot tub until your health care provider approves.  Do not drink alcohol for 24 hours after your procedure.  Keep all follow-up visits as told by your health care provider. This is important. Contact a health care provider if:  You have redness, mild swelling, or pain around your puncture site.  You have fluid or blood coming from your puncture site that stops after applying firm pressure to the area.  Your puncture site feels warm to the touch.  You have pus or a bad smell coming from your puncture site.  You have a fever.  You have chest pain or discomfort that spreads to your neck, jaw, or arm.  You are sweating a lot.  You feel nauseous.  You have a fast or irregular heartbeat.  You have shortness of breath.  You are dizzy or light-headed and feel the need to lie down.  You have pain or numbness in the arm or leg closest to your puncture site. Get help right away if:  Your puncture site suddenly swells.  Your puncture site is bleeding and the bleeding does not stop after applying firm pressure to the area. These symptoms may represent a serious problem that is an emergency. Do not wait to see if the symptoms will go away. Get medical help right away. Call your local emergency services (911 in the [place] Do not drive yourself to the hospital. Summary  After the procedure, it is normal to have bruising and tenderness at the puncture site in your groin, neck, or forearm.  Check your puncture site every day for signs of infection.  Get help right away if your puncture site is bleeding and the bleeding does not stop after applying firm pressure to the area. This is a medical emergency. This information is not intended to replace advice given to you by your health care provider. Make sure you discuss any questions you have with your health care provider. Document Released: 06/03/2016 Document Revised:  06/03/2016 Document Reviewed: 06/03/2016 Elsevier Interactive Patient Education  2018 [employer].

## 2017-07-11 NOTE — Progress Notes (Addendum)
Bleeding noted right groin pressure held x and no further bleeding noted and no hematoma; site right groin bruised;  Dr Ladona Ridgel notified and bedrest for 2 additional hours and may d/c home at that time if no further bleeding

## 2017-07-11 NOTE — Interval H&P Note (Signed)
History and Physical Interval Note:  07/11/2017 7:25 AM  Adam Glover  has presented today for surgery, with the diagnosis of svt  The various methods of treatment have been discussed with the patient and family. After consideration of risks, benefits and other options for treatment, the patient has consented to  Procedure(s): SVT ABLATION POSS PPM (N/A) as a surgical intervention .  The patient's history has been reviewed, patient examined, no change in status, stable for surgery.  I have reviewed the patient's chart and labs.  Questions were answered to the patient's satisfaction.     Lewayne Sanger

## 2017-07-11 NOTE — Progress Notes (Addendum)
Site area: RFV x 3 Site Prior to Removal:  Level 0 Pressure Applied For: 30 min Manual:  yes  Patient Status During Pull: stable  Post Pull Site:  Level 0 Post Pull Instructions Given:  yes Post Pull Pulses Present: palpable Dressing Applied:  Clear/gauze Bedrest begins @ 1050 till 1620 Comments: IJ removed-10 min pressure--level zero

## 2017-07-11 NOTE — Progress Notes (Signed)
Dr Ladona Ridgel in and checked client and OK to d/c home per Dr Ladona Ridgel

## 2017-07-12 ENCOUNTER — Encounter (HOSPITAL_COMMUNITY): Payer: Self-pay | Admitting: Internal Medicine

## 2017-07-12 MED FILL — Midazolam HCl Inj 5 MG/5ML (Base Equivalent): INTRAMUSCULAR | Qty: 5 | Status: AC

## 2017-08-11 ENCOUNTER — Encounter: Payer: Self-pay | Admitting: Internal Medicine

## 2017-08-11 ENCOUNTER — Ambulatory Visit: Payer: Managed Care, Other (non HMO) | Admitting: Internal Medicine

## 2017-08-11 VITALS — BP 134/86 | HR 88 | Ht 75.0 in | Wt 210.0 lb

## 2017-08-11 DIAGNOSIS — R002 Palpitations: Secondary | ICD-10-CM

## 2017-08-11 DIAGNOSIS — I456 Pre-excitation syndrome: Secondary | ICD-10-CM

## 2017-08-11 NOTE — Progress Notes (Signed)
HPI [name] returns today for followup. He is a pleasant 47 yo man with a h/o WPW who underwent EP study and ablation of a right Anteroseptal AP. He has no additional evidence of recurrent SVT and has no evidence of AP conduction on ECG. He notes that his blood pressure off of the beta blocker has been up a bit. He denies chest pain or sob. He still has palpitations which are very short lived. No sustained heart racing. Allergies  Allergen Reactions  . Ilosone [Erythromycin Estolate] Diarrhea and Other (See Comments)    blisters     Current Outpatient Medications  Medication Sig Dispense Refill  . Ascorbic Acid (SM CHEWABLE VITAMIN C) 500 MG CHEW Chew 1 tablet by mouth daily.     . Cholecalciferol (VITAMIN D) 2000 units tablet Take 2,000 Units by mouth daily.     . valACYclovir (VALTREX) 1000 MG tablet TAKE 2 TABS 2 TIMES A DAY FOR 1 DAY (Patient taking differently: TAKE 2 TABS 2 TIMES A DAY FOR 1 DAY AS NEEDED FOR COLD SORES) 20 tablet 0  . Zinc 30 MG TABS Take 1 tablet by mouth.    . metoprolol tartrate (LOPRESSOR) 25 MG tablet Take 1 tablet (25 mg total) by mouth 2 (two) times daily. (Patient not taking: Reported on 08/11/2017) 180 tablet 3   No current facility-administered medications for this visit.      Past Medical History:  Diagnosis Date  . Anal fissure   . Diverticulosis of colon    hx/o diverticulitis x 1  . Herpes labialis   . Hypertension   . Hypertriglyceridemia   . IBS (irritable bowel syndrome)   . Seborrheic dermatitis   . Tinnitus   . Vitamin D deficiency   . WPW (Wolff-Parkinson-White syndrome) 1990   ablation    ROS:   All systems reviewed and negative except as noted in the HPI.   Past Surgical History:  Procedure Laterality Date  . CARDIAC ELECTROPHYSIOLOGY MAPPING AND ABLATION  1990   hx/o WPW  . rectal condaloma procedure    . SVT ABLATION N/A 07/11/2017   Procedure: SVT ABLATION POSS PPM;  Surgeon: Marinus Mawaylor, Dequita Schleicher W, MD;  Location: [facility]  INVASIVE CV LAB;  Service: Cardiovascular;  Laterality: N/A;     Family History  Problem Relation Age of Onset  . Breast cancer Maternal Grandmother   . Hypertension Maternal Grandmother   . Dementia Maternal Grandmother   . Heart disease Maternal Grandmother   . Cancer Paternal Grandmother   . Diabetes Paternal Grandmother   . Stroke Paternal Grandmother   . Heart murmur Paternal Grandmother   . Pancreatic cancer Paternal Grandfather        questionable  . Breast cancer Mother   . Diabetes Mother   . Colon polyps Mother   . Prostate cancer Father [id number]  . Seizures Father   . Heart disease Father        BBB  . Vitamin D deficiency Father   . Other Father        bundle branch block  . Vitamin D deficiency Brother   . Diabetes Brother   . Other Maternal Grandfather        mucoid cancer  . Colon polyps Brother      Social History   Socioeconomic History  . Marital status: Single    Spouse name: Not on file  . Number of children: Not on file  . Years of education: Not on  file  . Highest education level: Not on file  Occupational History  . Not on file  Social Needs  . Financial resource strain: Not on file  . Food insecurity:    Worry: Not on file    Inability: Not on file  . Transportation needs:    Medical: Not on file    Non-medical: Not on file  Tobacco Use  . Smoking status: Never Smoker  . Smokeless tobacco: Never Used  Substance and Sexual Activity  . Alcohol use: No    Alcohol/week: 0.0 oz  . Drug use: No  . Sexual activity: Not Currently    Birth control/protection: Condom  Lifestyle  . Physical activity:    Days per week: Not on file    Minutes per session: Not on file  . Stress: Not on file  Relationships  . Social connections:    Talks on phone: Not on file    Gets together: Not on file    Attends religious service: Not on file    Active member of club or organization: Not on file    Attends meetings of clubs or organizations: Not on file      Relationship status: Not on file  . Intimate partner violence:    Fear of current or ex partner: Not on file    Emotionally abused: Not on file    Physically abused: Not on file    Forced sexual activity: Not on file  Other Topics Concern  . Not on file  Social History Narrative   In relationship with another male x 1+ year, but hx/o multiple sexual partners, not exercising     BP 134/86   Pulse 88   Ht 6\' 3"  (1.905 m)   Wt 210 lb (95.3 kg)   BMI 26.25 kg/m   Physical Exam:  Well appearing NAD HEENT: Unremarkable Neck:  No JVD, no thyromegally Lymphatics:  No adenopathy Back:  No CVA tenderness Lungs:  Clear HEART:  Regular rate rhythm, no murmurs, no rubs, no clicks Abd:  soft, positive bowel sounds, no organomegally, no rebound, no guarding Ext:  2 plus pulses, no edema, no cyanosis, no clubbing Skin:  No rashes no nodules Neuro:  CN II through XII intact, motor grossly intact  EKG - nsr with no pre-excitation   Assess/Plan: 1. WPW - he has had no evidence of recurrence of his WPW. He does have occaisional palpitations. I have recommended he avoid caffeine.  2. HTN - we discussed starting low dose toprol. He has a prescription and I mentioned that this would also help his palpitations.  Leonia Reeves.D.

## 2017-08-11 NOTE — Patient Instructions (Addendum)
Medication Instructions:  Your physician has recommended you make the following change in your medication:  1.  You may restart your metoprolol if needed.     Labwork: None ordered.  Testing/Procedures: None ordered.  Follow-Up: Your physician wants you to follow-up in: 6 months with Dr. Ladona Ridgelaylor.   You will receive a reminder letter in the mail two months in advance. If you don't receive a letter, please call our office to schedule the follow-up appointment.  Any Other Special Instructions Will Be Listed Below (If Applicable).  If you need a refill on your cardiac medications before your next appointment, please call your pharmacy.

## 2018-03-20 ENCOUNTER — Ambulatory Visit (INDEPENDENT_AMBULATORY_CARE_PROVIDER_SITE_OTHER): Payer: 59 | Admitting: Internal Medicine

## 2018-03-20 ENCOUNTER — Encounter (INDEPENDENT_AMBULATORY_CARE_PROVIDER_SITE_OTHER): Payer: Self-pay

## 2018-03-20 ENCOUNTER — Encounter: Payer: Self-pay | Admitting: Internal Medicine

## 2018-03-20 VITALS — BP 118/82 | HR 82 | Ht 75.0 in | Wt 210.4 lb

## 2018-03-20 DIAGNOSIS — I456 Pre-excitation syndrome: Secondary | ICD-10-CM | POA: Diagnosis not present

## 2018-03-20 NOTE — Progress Notes (Signed)
HPI [name] returns today for followup of his prior WPW, s/p ablation of an anteroseptal pathway. He has done well in the past year. He has had no sustained SVT. He has not had chest pain or syncope. He has had rare palpitations when he ate too much chocolate. Allergies  Allergen Reactions  . Ilosone [Erythromycin Estolate] Diarrhea and Other (See Comments)    blisters     Current Outpatient Medications  Medication Sig Dispense Refill  . Ascorbic Acid (SM CHEWABLE VITAMIN C) 500 MG CHEW Chew 1 tablet by mouth daily.     . Cholecalciferol (VITAMIN D) 2000 units tablet Take 2,000 Units by mouth daily.     . metoprolol tartrate (LOPRESSOR) 25 MG tablet Take 1 tablet (25 mg total) by mouth 2 (two) times daily. 180 tablet 3  . valACYclovir (VALTREX) 1000 MG tablet TAKE 2 TABS 2 TIMES A DAY FOR 1 DAY (Patient taking differently: TAKE 2 TABS 2 TIMES A DAY FOR 1 DAY AS NEEDED FOR COLD SORES) 20 tablet 0  . Zinc 30 MG TABS Take 1 tablet by mouth.     No current facility-administered medications for this visit.      Past Medical History:  Diagnosis Date  . Anal fissure   . Diverticulosis of colon    hx/o diverticulitis x 1  . Herpes labialis   . Hypertension   . Hypertriglyceridemia   . IBS (irritable bowel syndrome)   . Seborrheic dermatitis   . Tinnitus   . Vitamin D deficiency   . WPW (Wolff-Parkinson-White syndrome) 1990   ablation    ROS:   All systems reviewed and negative except as noted in the HPI.   Past Surgical History:  Procedure Laterality Date  . CARDIAC ELECTROPHYSIOLOGY MAPPING AND ABLATION  1990   hx/o WPW  . rectal condaloma procedure    . SVT ABLATION N/A 07/11/2017   Procedure: SVT ABLATION POSS PPM;  Surgeon: Marinus Maw, MD;  Location: [facility];  Service: Cardiovascular;  Laterality: N/A;     Family History  Problem Relation Age of Onset  . Breast cancer Maternal Grandmother   . Hypertension Maternal Grandmother   .  Dementia Maternal Grandmother   . Heart disease Maternal Grandmother   . Cancer Paternal Grandmother   . Diabetes Paternal Grandmother   . Stroke Paternal Grandmother   . Heart murmur Paternal Grandmother   . Pancreatic cancer Paternal Grandfather        questionable  . Breast cancer Mother   . Diabetes Mother   . Colon polyps Mother   . Prostate cancer Father 11  . Seizures Father   . Heart disease Father        BBB  . Vitamin D deficiency Father   . Other Father        bundle branch block  . Vitamin D deficiency Brother   . Diabetes Brother   . Other Maternal Grandfather        mucoid cancer  . Colon polyps Brother      Social History   Socioeconomic History  . Marital status: Single    Spouse name: Not on file  . Number of children: Not on file  . Years of education: Not on file  . Highest education level: Not on file  Occupational History  . Not on file  Social Needs  . Financial resource strain: Not on file  . Food insecurity:    Worry: Not  on file    Inability: Not on file  . Transportation needs:    Medical: Not on file    Non-medical: Not on file  Tobacco Use  . Smoking status: Never Smoker  . Smokeless tobacco: Never Used  Substance and Sexual Activity  . Alcohol use: No  . Drug use: No  . Sexual activity: Not Currently    Birth control/protection: Condom  Lifestyle  . Physical activity:    Days per week: Not on file    Minutes per session: Not on file  . Stress: Not on file  Relationships  . Social connections:    Talks on phone: Not on file    Gets together: Not on file    Attends religious service: Not on file    Active member of club or organization: Not on file    Attends meetings of clubs or organizations: Not on file    Relationship status: Not on file  . Intimate partner violence:    Fear of current or ex partner: Not on file    Emotionally abused: Not on file    Physically abused: Not on file    Forced sexual activity: Not on file   Other Topics Concern  . Not on file  Social History Narrative   In relationship with another male x 1+ year, but hx/o multiple sexual partners, not exercising     Ht 6\' 3"  (1.905 m)   BMI 26.25 kg/m   Physical Exam:  Well appearing 48 yo man, NAD HEENT: Unremarkable Neck:  6 cm JVD, no thyromegally Lymphatics:  No adenopathy Back:  No CVA tenderness Lungs:  Clear with no wheezes HEART:  Regular rate rhythm, no murmurs, no rubs, no clicks Abd:  soft, positive bowel sounds, no organomegally, no rebound, no guarding Ext:  2 plus pulses, no edema, no cyanosis, no clubbing Skin:  No rashes no nodules Neuro:  CN II through XII intact, motor grossly intact  EKG - nsr with RBBB   Assess/Plan: 1. WPW - he is s/p ablation and is doing well. He will undergo watchful waiting but no evidence of recurrent AP conduction . 2. Palpitations - he does have occaisional symptomst which are non-sustained and likely due to either PAC's or PVC's.  Leonia Reeves.D.

## 2018-03-20 NOTE — Patient Instructions (Addendum)
Medication Instructions:  Your physician recommends that you continue on your current medications as directed. Please refer to the Current Medication list given to you today.  Labwork: None ordered.  Testing/Procedures: None ordered.  Follow-Up: Your physician wants you to follow-up in: as needed with Dr. Taylor.      Any Other Special Instructions Will Be Listed Below (If Applicable).  If you need a refill on your cardiac medications before your next appointment, please call your pharmacy.   

## 2018-05-07 IMAGING — CR DG CHEST 2V
2 series · 2 of 2 positions shown · non-contrast
Comparison: Chest x-ray of February 05, 2015

CLINICAL DATA: Wheezing on the right. Recently treated for
bronchitis. Nonsmoker.

EXAM:
CHEST  2 VIEW

[w chest pa]
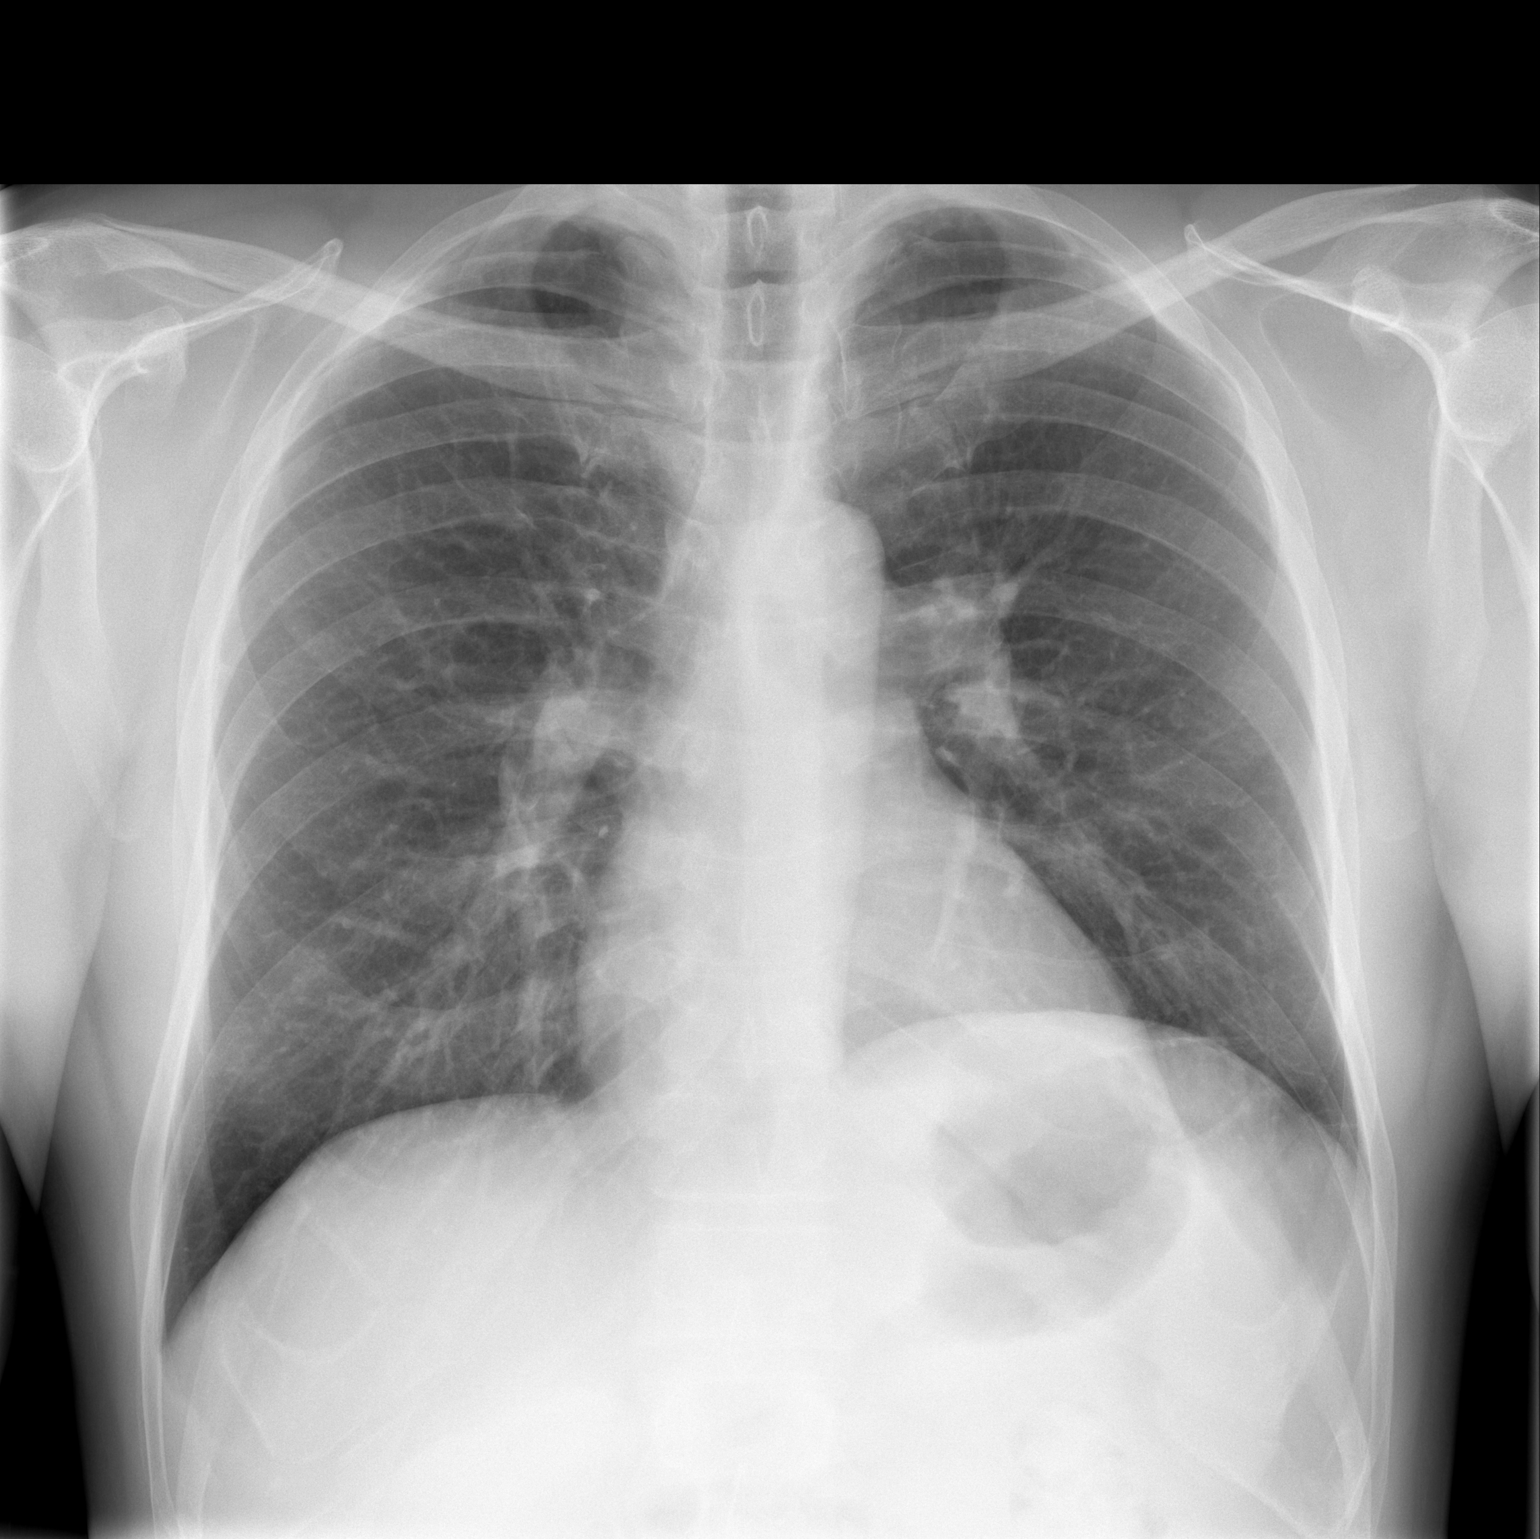

[w chest lat]
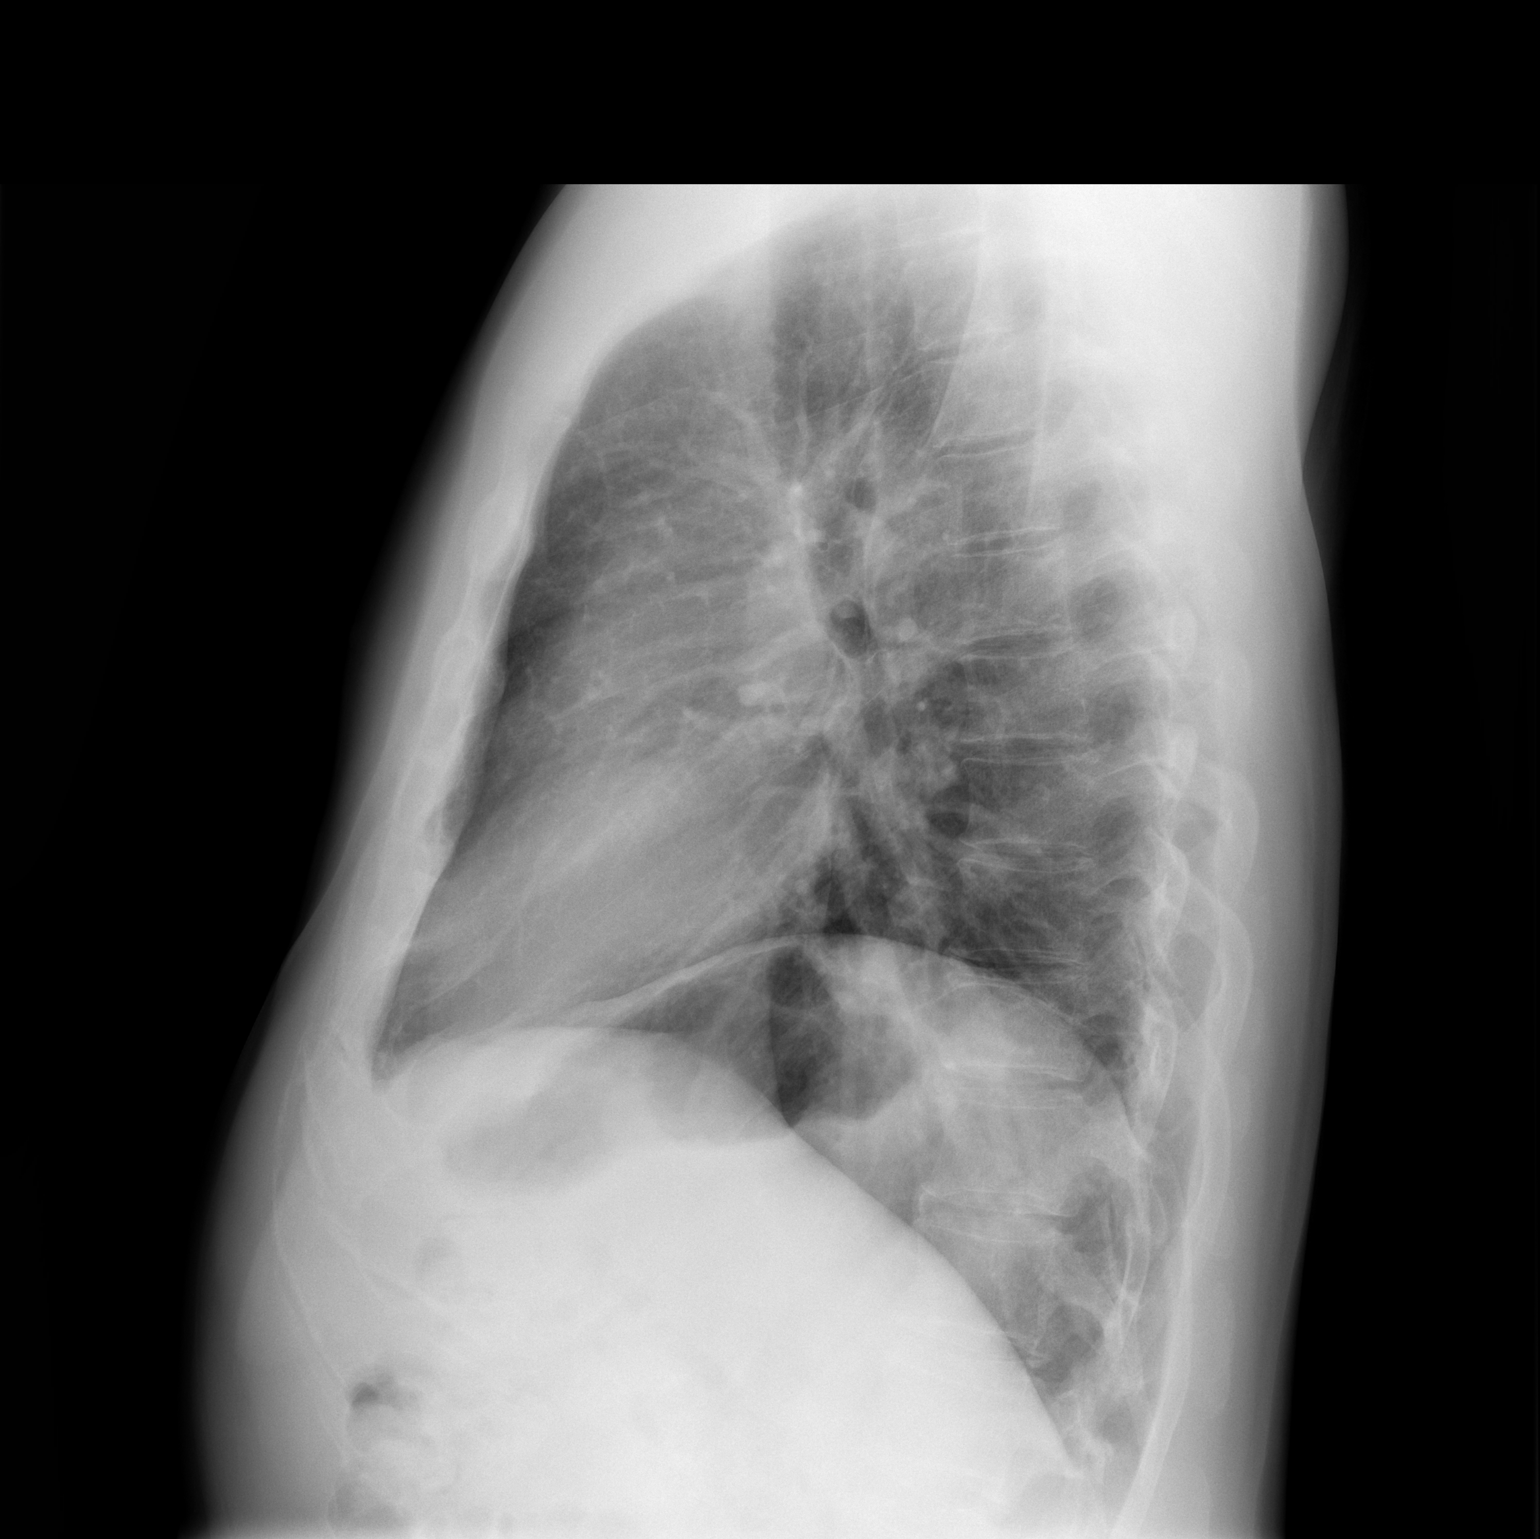

[2 of 2 positions shown; findings below may reference images not displayed]

FINDINGS: The lungs are adequately inflated. There is no focal infiltrate.
There is no pleural effusion. The heart and pulmonary vascularity
are normal. The mediastinum is normal in width. The central
pulmonary vascularity is prominent but stable. The bony thorax is
unremarkable.
IMPRESSION: There is no pneumonia nor other acute cardiopulmonary abnormality.

## 2018-06-13 ENCOUNTER — Encounter: Payer: Managed Care, Other (non HMO) | Admitting: Family Medicine

## 2018-09-18 ENCOUNTER — Telehealth: Payer: Self-pay

## 2018-09-18 NOTE — Telephone Encounter (Signed)
Called pt to advise of pt appt 09-26-18 for in office . Screening needed and advise of new check in. [facility]

## 2018-09-26 ENCOUNTER — Encounter: Payer: Self-pay | Admitting: Family Medicine

## 2018-09-26 ENCOUNTER — Other Ambulatory Visit: Payer: Self-pay

## 2018-09-26 ENCOUNTER — Ambulatory Visit (INDEPENDENT_AMBULATORY_CARE_PROVIDER_SITE_OTHER): Payer: Managed Care, Other (non HMO) | Admitting: Family Medicine

## 2018-09-26 VITALS — BP 122/80 | HR 79 | Temp 97.7°F | Ht 75.0 in | Wt 205.4 lb

## 2018-09-26 DIAGNOSIS — E781 Pure hyperglyceridemia: Secondary | ICD-10-CM

## 2018-09-26 DIAGNOSIS — L821 Other seborrheic keratosis: Secondary | ICD-10-CM | POA: Diagnosis not present

## 2018-09-26 DIAGNOSIS — Z0000 Encounter for general adult medical examination without abnormal findings: Secondary | ICD-10-CM | POA: Diagnosis not present

## 2018-09-26 DIAGNOSIS — B079 Viral wart, unspecified: Secondary | ICD-10-CM

## 2018-09-26 DIAGNOSIS — H9313 Tinnitus, bilateral: Secondary | ICD-10-CM | POA: Diagnosis not present

## 2018-09-26 DIAGNOSIS — J3489 Other specified disorders of nose and nasal sinuses: Secondary | ICD-10-CM

## 2018-09-26 DIAGNOSIS — Z209 Contact with and (suspected) exposure to unspecified communicable disease: Secondary | ICD-10-CM

## 2018-09-26 DIAGNOSIS — B001 Herpesviral vesicular dermatitis: Secondary | ICD-10-CM

## 2018-09-26 DIAGNOSIS — I456 Pre-excitation syndrome: Secondary | ICD-10-CM | POA: Diagnosis not present

## 2018-09-26 MED ORDER — VALACYCLOVIR HCL 1 G PO TABS: ORAL_TABLET | ORAL | 0 refills | Status: DC

## 2018-09-26 MED ORDER — BACTROBAN NASAL 2 % NA OINT: 1.0000 "application " | TOPICAL_OINTMENT | Freq: Two times a day (BID) | NASAL | 0 refills | Status: DC

## 2018-09-26 NOTE — Progress Notes (Signed)
Subjective:    Patient ID: Adam Glover, male    DOB: 08-Mar-1971, 48 y.o.   MRN: [id number]  HPI He is here for complete examination.  He does have a lesion on his scalp that he would like evaluated.  It is causing some difficulty especially when he washes and combs his hair.  He also has a lesion on the left temporal area that has been there for several years.  He did have a WPW ablation in 2018 and was seen by cardiology in January.  He is no longer taking a beta-blocker.  He also has a lesion in the right anterior nares that is sometimes giving him some trouble.  He has had difficulty with  herpes labialis and would like to have a prescription for Valtrex.  He does have a history of tinnitus as well as hypertriglyceridemia.  He has not been sexually active in quite some time but would like to be STD tested just to be safe.  His work is going well.  Family and social history as well as health maintenance and immunizations was reviewed.  He did say that his brother and mother have had polyps but he is unsure what kind of colonic polyps they were.   Review of Systems  All other systems reviewed and are negative.      Objective:   Physical Exam BP 122/80 (BP Location: Left Arm, Patient Position: Sitting)   Pulse 79   Temp 97.7 F (36.5 C)   Ht 6\' 3"  (1.905 m)   Wt 205 lb 6.4 oz (93.2 kg)   SpO2 94%   BMI 25.67 kg/m   General Appearance:    Alert, cooperative, no distress, appears stated age  Head:    Normocephalic, without obvious abnormality, atraumatic  Eyes:    PERRL, conjunctiva/corneas clear, EOM's intact, fundi    benign  Ears:    Normal TM's and external ear canals  Nose:   Nares normal, mucosa normal, no drainage or sinus   tenderness  Throat:   Lips, mucosa, and tongue normal; teeth and gums normal  Neck:   Supple, no lymphadenopathy;  thyroid:  no   enlargement/tenderness/nodules; no carotid   bruit or JVD     Lungs:     Clear to auscultation bilaterally without  wheezes, rales or     ronchi; respirations unlabored      Heart:    Regular rate and rhythm, S1 and S2 normal, no murmur, rub   or gallop     Abdomen:     Soft, non-tender, nondistended, normoactive bowel sounds,    no masses, no hepatosplenomegaly  Genitalia:    Normal male external genitalia without lesions.  Testicles without masses.  No inguinal hernias.  Rectal:    Deferredd  Extremities:   No clubbing, cyanosis or edema  Pulses:   2+ and symmetric all extremities  Skin:   Skin color, texture, turgor normal, a 1 x 1-1/2 cm pigmented well-demarcated lesion noted on the left temporal area.  He also has a raised wartlike lesion on his scalp mid parietal area.  Lymph nodes:   Cervical, supraclavicular, and axillary nodes normal  Neurologic:   CNII-XII intact, normal strength, sensation and gait; reflexes 2+ and symmetric throughout          Psych:   Normal mood, affect, hygiene and grooming.          Assessment & Plan:  Routine general medical examination at a health care facility - Plan:  CBC with Differential/Platelet, Comprehensive metabolic panel, Lipid panel,   Herpes labialis - Plan: valACYclovir (VALTREX) 1000 MG tablet,   WPW syndrome - Plan: No further intervention needed as per cardiology note.  Seborrheic keratosis - Plan: Discussed seborrheic keratosis and the fact that with time he will see more more of these.  Tinnitus of both ears - Plan: No intervention available.  This was explained to him.  Hypertriglyceridemia - Plan: lipids  Nasal lesion - Plan: mupirocin nasal ointment (BACTROBAN NASAL) 2 %,   Contact with or exposure to communicable disease - Plan: HIV Antibody (routine testing w rflx), GC/Chlamydia Probe Amp, RPR,   Viral warts, unspecified type - Plan: Recommend he set up an appointment to come back for removal of the wart.

## 2018-09-27 LAB — CBC WITH DIFFERENTIAL/PLATELET
Basophils Absolute: 0.1 10*3/uL (ref 0.0–0.2)
Basos: 1 %
EOS (ABSOLUTE): 0.1 10*3/uL (ref 0.0–0.4)
Eos: 1 %
Hematocrit: 47.8 % (ref 37.5–51.0)
Hemoglobin: 16.2 g/dL (ref 13.0–17.7)
Immature Grans (Abs): 0 10*3/uL (ref 0.0–0.1)
Immature Granulocytes: 0 %
Lymphocytes Absolute: 2.8 10*3/uL (ref 0.7–3.1)
Lymphs: 39 %
MCH: 33.4 pg — ABNORMAL HIGH (ref 26.6–33.0)
MCHC: 33.9 g/dL (ref 31.5–35.7)
MCV: 99 fL — ABNORMAL HIGH (ref 79–97)
Monocytes Absolute: 0.5 10*3/uL (ref 0.1–0.9)
Monocytes: 7 %
Neutrophils Absolute: 3.6 10*3/uL (ref 1.4–7.0)
Neutrophils: 52 %
Platelets: 169 10*3/uL (ref 150–450)
RBC: 4.85 x10E6/uL (ref 4.14–5.80)
RDW: 12.4 % (ref 11.6–15.4)
WBC: 7 10*3/uL (ref 3.4–10.8)

## 2018-09-27 LAB — COMPREHENSIVE METABOLIC PANEL
ALT: 22 IU/L (ref 0–44)
AST: 18 IU/L (ref 0–40)
Albumin/Globulin Ratio: 1.9 (ref 1.2–2.2)
Albumin: 4.8 g/dL (ref 4.0–5.0)
Alkaline Phosphatase: 115 IU/L (ref 39–117)
BUN/Creatinine Ratio: 13 (ref 9–20)
BUN: 15 mg/dL (ref 6–24)
Bilirubin Total: 0.4 mg/dL (ref 0.0–1.2)
CO2: 21 mmol/L (ref 20–29)
Calcium: 9.6 mg/dL (ref 8.7–10.2)
Chloride: 99 mmol/L (ref 96–106)
Creatinine, Ser: 1.15 mg/dL (ref 0.76–1.27)
GFR calc Af Amer: 86 mL/min/{1.73_m2} (ref >59)
GFR calc non Af Amer: 74 mL/min/{1.73_m2} (ref >59)
Globulin, Total: 2.5 g/dL (ref 1.5–4.5)
Glucose: 103 mg/dL — ABNORMAL HIGH (ref 65–99)
Potassium: 4.5 mmol/L (ref 3.5–5.2)
Sodium: 140 mmol/L (ref 134–144)
Total Protein: 7.3 g/dL (ref 6.0–8.5)

## 2018-09-27 LAB — LIPID PANEL
Chol/HDL Ratio: 6.5 ratio — ABNORMAL HIGH (ref 0.0–5.0)
Cholesterol, Total: 241 mg/dL — ABNORMAL HIGH (ref 100–199)
HDL: 37 mg/dL — ABNORMAL LOW (ref >39)
LDL Calculated: 129 mg/dL — ABNORMAL HIGH (ref 0–99)
Triglycerides: 376 mg/dL — ABNORMAL HIGH (ref 0–149)
VLDL Cholesterol Cal: 75 mg/dL — ABNORMAL HIGH (ref 5–40)

## 2018-09-27 LAB — HIV ANTIBODY (ROUTINE TESTING W REFLEX): HIV Screen 4th Generation wRfx: NONREACTIVE

## 2018-09-27 LAB — RPR: RPR Ser Ql: NONREACTIVE

## 2018-09-29 LAB — GC/CHLAMYDIA PROBE AMP
Chlamydia trachomatis, NAA: NEGATIVE
Neisseria Gonorrhoeae by PCR: NEGATIVE

## 2018-10-19 ENCOUNTER — Telehealth: Payer: Self-pay

## 2018-10-19 ENCOUNTER — Telehealth: Payer: Self-pay | Admitting: Family Medicine

## 2018-10-19 NOTE — Telephone Encounter (Signed)
[facility] req Mup[facility]int[facility] for nasal

## 2018-10-19 NOTE — Telephone Encounter (Signed)
[facility] for mupirocine 2%  Qty 22 grams dueto other not being in stock . Please advise . [place]

## 2018-10-20 NOTE — Telephone Encounter (Signed)
ok 

## 2018-10-20 NOTE — Telephone Encounter (Signed)
There was a previous note about this.

## 2018-10-23 ENCOUNTER — Telehealth: Payer: Self-pay | Admitting: Family Medicine

## 2018-10-23 DIAGNOSIS — J3489 Other specified disorders of nose and nasal sinuses: Secondary | ICD-10-CM

## 2018-10-23 MED ORDER — BACTROBAN NASAL 2 % NA OINT: 1.0000 "application " | TOPICAL_OINTMENT | Freq: Two times a day (BID) | NASAL | 0 refills | Status: DC

## 2018-10-23 NOTE — Telephone Encounter (Signed)
[facility]ax [facility]ill request from [facility]  Mupirocin 2% ointment

## 2018-10-23 NOTE — Telephone Encounter (Signed)
Called in. K[facility] 

## 2019-01-29 ENCOUNTER — Other Ambulatory Visit: Payer: Self-pay

## 2019-01-29 DIAGNOSIS — Z20822 Contact with and (suspected) exposure to covid-19: Secondary | ICD-10-CM

## 2019-01-31 ENCOUNTER — Encounter: Payer: Self-pay | Admitting: Family Medicine

## 2019-01-31 LAB — NOVEL CORONAVIRUS, NAA: SARS-CoV-2, NAA: DETECTED — AB

## 2019-02-07 ENCOUNTER — Ambulatory Visit (INDEPENDENT_AMBULATORY_CARE_PROVIDER_SITE_OTHER): Payer: No Typology Code available for payment source | Admitting: Family Medicine

## 2019-02-07 ENCOUNTER — Encounter: Payer: Self-pay | Admitting: Family Medicine

## 2019-02-07 ENCOUNTER — Other Ambulatory Visit: Payer: Self-pay

## 2019-02-07 VITALS — Temp 97.6°F | Wt 205.0 lb

## 2019-02-07 DIAGNOSIS — U071 COVID-19: Secondary | ICD-10-CM | POA: Diagnosis not present

## 2019-02-07 NOTE — Progress Notes (Signed)
   Subjective:    Patient ID: Adam Glover, male    DOB: October 21, 1970, 48 y.o.   MRN: [id number]  HPI Documentation for virtual telephone encounter.  Documentation for virtual audio and video telecommunications through [place] encounter: The patient was located at home. The provider was located in the office. The patient did consent to this visit and is aware of possible charges through their insurance for this visit.  The other persons participating in this telemedicine service were none. This virtual service is not related to other E/M service within previous 7 days. He states that on November 20 he started developing fever and congestion, he was tested on the 23rd.  He states that at this time he is doing much better having only slight cough with a pleuritic component to it.  No fever, shortness of breath, sore throat, earache, smell or taste change.    Review of Systems     Objective:   Physical Exam Alert and in no distress.  His respiratory rate appears normal      Assessment & Plan:  COVID-19 I explained that he is now 10 days down the road, no fever or shortness of breath and may return to work as per [employer] recommendations.  He states that his employer would not have him come back until 14 days. I offered to write him a note but he will go back after 14 days.

## 2020-07-28 ENCOUNTER — Encounter: Payer: Self-pay | Admitting: Family Medicine

## 2020-07-28 ENCOUNTER — Telehealth: Payer: Self-pay | Admitting: Family Medicine

## 2020-07-28 DIAGNOSIS — B001 Herpesviral vesicular dermatitis: Secondary | ICD-10-CM

## 2020-07-28 MED ORDER — VALACYCLOVIR HCL 1 G PO TABS: ORAL_TABLET | ORAL | 0 refills | Status: DC

## 2020-07-28 NOTE — Telephone Encounter (Signed)
Pt called and made a cpe appt. He is having a outbreak of cold sores and would like a refill of Valtrex. Please send to [facility].

## 2020-08-12 ENCOUNTER — Telehealth (INDEPENDENT_AMBULATORY_CARE_PROVIDER_SITE_OTHER): Payer: No Typology Code available for payment source | Admitting: Family Medicine

## 2020-08-12 ENCOUNTER — Telehealth: Payer: Self-pay | Admitting: Family Medicine

## 2020-08-12 ENCOUNTER — Other Ambulatory Visit: Payer: Self-pay

## 2020-08-12 ENCOUNTER — Encounter: Payer: Self-pay | Admitting: Family Medicine

## 2020-08-12 ENCOUNTER — Encounter: Payer: No Typology Code available for payment source | Admitting: Family Medicine

## 2020-08-12 VITALS — Temp 99.7°F | Wt 210.6 lb

## 2020-08-12 DIAGNOSIS — U071 COVID-19: Secondary | ICD-10-CM | POA: Diagnosis not present

## 2020-08-12 LAB — NOVEL CORONAVIRUS, NAA: SARS-CoV-2, NAA: DETECTED

## 2020-08-12 NOTE — Progress Notes (Signed)
   Subjective:    Patient ID: Adam Glover, male    DOB: Feb 20, 1971, 50 y.o.   MRN: [id number]  HPI Documentation for virtual audio and video telecommunications through [place] encounter: The patient was located at home. 2 patient identifiers used.  The provider was located in the office. The patient did consent to this visit and is aware of possible charges through their insurance for this visit. The other persons participating in this telemedicine service were none. Time spent on call was 5 minutes and in review of previous records >20 minutes total for counseling and coordination of care. This virtual service is not related to other E/M service within previous 7 days. He states that he has had difficulty with sneezing, sore throat, headache, fever and rhinorrhea that started yesterday and get slightly worse today.  He has not done any COVID testing and has not had any vaccines.  He has a COVID test but has not done it yet.  Instructed him to go ahead and take the test.  Review of Systems     Objective:   Physical Exam Alert and hoarse voice noted. COVID test came back positive      Assessment & Plan:  COVID-19 Recommend he use an antihistamine as needed.  He can also use Robitussin-DM and NyQuil at night.  2 Tylenol 4 times per day as needed for aches and pains.  He may return to leaving the house on Saturday with a mask for another 5 days.  If his symptoms worsen in regard to fever, coughing, shortness of breath he will call me.  He was comfortable with that.  He is also to inform any but he has been around in the last 3 to 5 days of exposure.

## 2020-08-12 NOTE — Telephone Encounter (Signed)
error 

## 2021-02-19 ENCOUNTER — Ambulatory Visit (INDEPENDENT_AMBULATORY_CARE_PROVIDER_SITE_OTHER): Payer: No Typology Code available for payment source | Admitting: Family Medicine

## 2021-02-19 ENCOUNTER — Other Ambulatory Visit: Payer: Self-pay

## 2021-02-19 ENCOUNTER — Encounter: Payer: Self-pay | Admitting: Family Medicine

## 2021-02-19 VITALS — BP 170/110 | HR 83 | Temp 97.8°F | Ht 74.0 in | Wt 210.2 lb

## 2021-02-19 DIAGNOSIS — Z23 Encounter for immunization: Secondary | ICD-10-CM | POA: Diagnosis not present

## 2021-02-19 DIAGNOSIS — I456 Pre-excitation syndrome: Secondary | ICD-10-CM

## 2021-02-19 DIAGNOSIS — R030 Elevated blood-pressure reading, without diagnosis of hypertension: Secondary | ICD-10-CM

## 2021-02-19 DIAGNOSIS — E781 Pure hyperglyceridemia: Secondary | ICD-10-CM | POA: Diagnosis not present

## 2021-02-19 DIAGNOSIS — Z1211 Encounter for screening for malignant neoplasm of colon: Secondary | ICD-10-CM

## 2021-02-19 DIAGNOSIS — Z0000 Encounter for general adult medical examination without abnormal findings: Secondary | ICD-10-CM | POA: Diagnosis not present

## 2021-02-19 DIAGNOSIS — Z8616 Personal history of COVID-19: Secondary | ICD-10-CM

## 2021-02-19 DIAGNOSIS — Z1159 Encounter for screening for other viral diseases: Secondary | ICD-10-CM

## 2021-02-19 NOTE — Progress Notes (Signed)
° °  Subjective:    Patient ID: Adam Glover, male    DOB: 22-Aug-1970, 50 y.o.   MRN: [id number]  HPI He is here for complete exam.  He does have a history of COVID and June of this year.  He has not had immunizations and at present time is not interested in it.  He does have a history of WPW and has had an ablation.  Since the ablation he has not had any more cardiac arrhythmias.  He does not smoke and rarely drinks.  Is not sexually active.  Work is going well. Family and social history as well as health maintenance and immunizations was reviewed  Review of Systems  All other systems reviewed and are negative.     Objective:   Physical Exam Alert and in no distress. Tympanic membranes and canals are normal. Pharyngeal area is normal. Neck is supple without adenopathy or thyromegaly. Cardiac exam shows a regular sinus rhythm without murmurs or gallops. Lungs are clear to auscultation.        Assessment & Plan:  Routine general medical examination at a health care facility - Plan: CBC with Differential/Platelet, Comprehensive metabolic panel, Lipid panel  WPW syndrome  Hypertriglyceridemia - Plan: Lipid panel  Need for Tdap vaccination - Plan: Tdap vaccine greater than or equal to 7yo IM  Elevated blood pressure reading  Screening for colon cancer - Plan: Cologuard  Need for hepatitis C screening test - Plan: Hepatitis C antibody  History of COVID-19 At the present time I think he seems be doing quite nicely.  We will follow-up on the Cologuard when the results come in.  Discussed COVID vaccine but he is not interested in that.  Did encourage him to become more physically active.

## 2021-02-20 LAB — COMPREHENSIVE METABOLIC PANEL
ALT: 29 IU/L (ref 0–44)
AST: 23 IU/L (ref 0–40)
Albumin/Globulin Ratio: 1.9 (ref 1.2–2.2)
Albumin: 4.7 g/dL (ref 3.8–4.9)
Alkaline Phosphatase: 125 IU/L — ABNORMAL HIGH (ref 44–121)
BUN/Creatinine Ratio: 13 (ref 9–20)
BUN: 14 mg/dL (ref 6–24)
Bilirubin Total: 0.7 mg/dL (ref 0.0–1.2)
CO2: 22 mmol/L (ref 20–29)
Calcium: 9.1 mg/dL (ref 8.7–10.2)
Chloride: 97 mmol/L (ref 96–106)
Creatinine, Ser: 1.05 mg/dL (ref 0.76–1.27)
Globulin, Total: 2.5 g/dL (ref 1.5–4.5)
Glucose: 94 mg/dL (ref 70–99)
Potassium: 4.1 mmol/L (ref 3.5–5.2)
Sodium: 137 mmol/L (ref 134–144)
Total Protein: 7.2 g/dL (ref 6.0–8.5)
eGFR: 86 mL/min/{1.73_m2} (ref >59)

## 2021-02-20 LAB — HEPATITIS C ANTIBODY: Hep C Virus Ab: <0.1 s/co ratio (ref 0.0–0.9)

## 2021-02-20 LAB — CBC WITH DIFFERENTIAL/PLATELET
Basophils Absolute: 0.1 10*3/uL (ref 0.0–0.2)
Basos: 1 %
EOS (ABSOLUTE): 0.1 10*3/uL (ref 0.0–0.4)
Eos: 2 %
Hematocrit: 47.1 % (ref 37.5–51.0)
Hemoglobin: 16.2 g/dL (ref 13.0–17.7)
Immature Grans (Abs): 0 10*3/uL (ref 0.0–0.1)
Immature Granulocytes: 0 %
Lymphocytes Absolute: 3 10*3/uL (ref 0.7–3.1)
Lymphs: 41 %
MCH: 33.3 pg — ABNORMAL HIGH (ref 26.6–33.0)
MCHC: 34.4 g/dL (ref 31.5–35.7)
MCV: 97 fL (ref 79–97)
Monocytes Absolute: 0.5 10*3/uL (ref 0.1–0.9)
Monocytes: 8 %
Neutrophils Absolute: 3.5 10*3/uL (ref 1.4–7.0)
Neutrophils: 48 %
Platelets: 169 10*3/uL (ref 150–450)
RBC: 4.87 x10E6/uL (ref 4.14–5.80)
RDW: 12.1 % (ref 11.6–15.4)
WBC: 7.2 10*3/uL (ref 3.4–10.8)

## 2021-02-20 LAB — LIPID PANEL
Chol/HDL Ratio: 6.8 ratio — ABNORMAL HIGH (ref 0.0–5.0)
Cholesterol, Total: 243 mg/dL — ABNORMAL HIGH (ref 100–199)
HDL: 36 mg/dL — ABNORMAL LOW (ref >39)
LDL Chol Calc (NIH): 150 mg/dL — ABNORMAL HIGH (ref 0–99)
Triglycerides: 309 mg/dL — ABNORMAL HIGH (ref 0–149)
VLDL Cholesterol Cal: 57 mg/dL — ABNORMAL HIGH (ref 5–40)

## 2021-02-20 MED ORDER — ATORVASTATIN CALCIUM 20 MG PO TABS: 20.0000 mg | ORAL_TABLET | Freq: Every day | ORAL | 3 refills | Status: DC

## 2021-02-20 NOTE — Addendum Note (Signed)
Addended by: Ronnald Nian on: 02/20/2021 08:41 AM   Modules accepted: Orders

## 2021-03-14 LAB — COLOGUARD: COLOGUARD: NEGATIVE

## 2021-03-23 ENCOUNTER — Encounter: Payer: Self-pay | Admitting: Family Medicine

## 2021-03-23 ENCOUNTER — Ambulatory Visit: Payer: No Typology Code available for payment source | Admitting: Family Medicine

## 2021-03-23 ENCOUNTER — Other Ambulatory Visit: Payer: Self-pay

## 2021-03-23 VITALS — BP 138/92 | HR 78 | Temp 97.5°F | Wt 205.8 lb

## 2021-03-23 DIAGNOSIS — Z833 Family history of diabetes mellitus: Secondary | ICD-10-CM | POA: Diagnosis not present

## 2021-03-23 DIAGNOSIS — R030 Elevated blood-pressure reading, without diagnosis of hypertension: Secondary | ICD-10-CM

## 2021-03-23 DIAGNOSIS — E781 Pure hyperglyceridemia: Secondary | ICD-10-CM | POA: Diagnosis not present

## 2021-03-23 DIAGNOSIS — I456 Pre-excitation syndrome: Secondary | ICD-10-CM

## 2021-03-23 NOTE — Progress Notes (Signed)
° °  Subjective:    Patient ID: Adam Glover, male    DOB: Jul 28, 1970, 51 y.o.   MRN: [id number]  HPI He is here for a recheck.  Recently his brother died however he seems to be handling this appropriately.  Apparently he had diabetes but not sure what exactly died from.  He has not been exercising and states the cardiologist told him not to exercise.  Presently he is not on metoprolol.  He has started taking Lipitor and has noted some slight muscle cramping but none of any great concern.   Review of Systems     Objective:   Physical Exam Alert and in no distress otherwise not examined.  Blood pressure is recorded.       Assessment & Plan:  Elevated blood pressure reading  WPW syndrome  Hypertriglyceridemia  Family history of diabetes mellitus (DM) His blood pressure has come down.  Recommend that he check into the Dash diet and also start a very light exercise program.  I explained that I thought the cardiologist was really talking to him about vigorous physical activity as opposed to not doing any at all.  He is to set up a return appointment in about 2 months at which time we will check his cholesterol and his blood pressure.  I explained that I would most likely place him back on Lopressor which she has been on in the past.

## 2021-03-23 NOTE — Patient Instructions (Signed)
Check out the DASH diet.  Start a regular walking program.  20 minutes of something physical daily.

## 2021-04-17 ENCOUNTER — Encounter: Payer: Self-pay | Admitting: Family Medicine

## 2021-04-17 ENCOUNTER — Ambulatory Visit: Payer: No Typology Code available for payment source | Admitting: Family Medicine

## 2021-04-17 ENCOUNTER — Other Ambulatory Visit: Payer: Self-pay

## 2021-04-17 VITALS — BP 136/88 | HR 102 | Temp 98.5°F | Wt 196.4 lb

## 2021-04-17 DIAGNOSIS — K3189 Other diseases of stomach and duodenum: Secondary | ICD-10-CM | POA: Diagnosis not present

## 2021-04-17 MED ORDER — HYOSCYAMINE SULFATE ER 0.375 MG PO TB12: 0.3750 mg | ORAL_TABLET | Freq: Two times a day (BID) | ORAL | 0 refills | Status: DC

## 2021-04-17 NOTE — Progress Notes (Signed)
° °  Subjective:    Patient ID: Adam Glover, male    DOB: Mar 26, 1970, 51 y.o.   MRN: [id number]  HPI He states that on Tuesday he was awakened by the mid epigastric pain followed by diarrhea times at least 10 stools.  On Wednesday he noted continued difficulty with the abdominal pain but it was a cramping type of pain that did get worse if he ate.  The same thing occurred on Thursday but to a lesser degree.  He had no nausea or vomiting, fever or chills.  He noted that any food would cause a slight cramping but less than on the first day. He then discussed the fact that since he has been on Lipitor he is thinking that it might be causing muscle aches and pains.   Review of Systems     Objective:   Physical Exam Alert and in no distress.  Abdominal exam shows slight midepigastric tenderness with normal bowel sounds and no masses.       Assessment & Plan:  Spasm of GI tract - Plan: hyoscyamine (LEVBID) 0.375 MG 12 hr tablet Since his pain seems to getting better, I will give him some Levbid to see if that will help it go away completely.  If he has continued difficulty he will certainly call. I then discussed the cramping sensations that he is having.  Explained that this could be an intolerance to statin.  Recommend that he hold off on that for several days and see if the pain goes away.  If it does not did say to try the pills again and if the symptoms recur, we will then switch him to a different statin.  He will keep me informed.

## 2021-05-21 ENCOUNTER — Encounter: Payer: Self-pay | Admitting: Family Medicine

## 2021-05-21 ENCOUNTER — Ambulatory Visit: Payer: No Typology Code available for payment source | Admitting: Family Medicine

## 2021-05-21 VITALS — BP 130/88 | HR 81 | Temp 98.2°F | Wt 193.6 lb

## 2021-05-21 DIAGNOSIS — I456 Pre-excitation syndrome: Secondary | ICD-10-CM | POA: Diagnosis not present

## 2021-05-21 DIAGNOSIS — E785 Hyperlipidemia, unspecified: Secondary | ICD-10-CM

## 2021-05-21 DIAGNOSIS — E781 Pure hyperglyceridemia: Secondary | ICD-10-CM

## 2021-05-21 DIAGNOSIS — I10 Essential (primary) hypertension: Secondary | ICD-10-CM | POA: Diagnosis not present

## 2021-05-21 MED ORDER — METOPROLOL TARTRATE 25 MG PO TABS: 25.0000 mg | ORAL_TABLET | Freq: Two times a day (BID) | ORAL | 3 refills | Status: DC

## 2021-05-21 MED ORDER — ROSUVASTATIN CALCIUM 10 MG PO TABS: 10.0000 mg | ORAL_TABLET | Freq: Every day | ORAL | 3 refills | Status: DC

## 2021-05-21 NOTE — Progress Notes (Signed)
? ?  Subjective:  ? ? Patient ID: Adam Glover, male    DOB: Nov 24, 1970, 50 y.o.   MRN: [id number] ? ?HPI ?He is here for recheck.  His blood pressure was elevated in the past.  Since then he has made some diet and exercise changes and is here for follow-up on that.  He also has been on a statin in the past however Lipitor causes some muscle aches and pains.  He did have elevated triglycerides and to a lesser extent lipids.  There is no family history of heart disease.  He does have concerns over statin causing increased blood sugar readings. ? ? ?Review of Systems ? ?   ?Objective:  ? Physical Exam ?Alert and in no distress otherwise not examined ? ? ? ?   ?Assessment & Plan:  ?Essential hypertension - Plan: metoprolol tartrate (LOPRESSOR) 25 MG tablet ? ?Hypertriglyceridemia - Plan: rosuvastatin (CRESTOR) 10 MG tablet ? ?Hyperlipidemia, unspecified hyperlipidemia type - Plan: rosuvastatin (CRESTOR) 10 MG tablet ? ?WPW syndrome ?I explained that I will try him on Crestor to see if that will help mitigate the likelihood of muscle aches and pains.  He will keep me informed concerning this.  Discussed statin use and elevated blood sugars explained that the risk versus benefit is definitely in favor of the benefit.  I then discussed his blood pressure recommending that we place him back on his beta-blocker at to get the dual benefit of blood pressure lowering and although he has not had any WPW symptoms, it was used for that purpose as well.  He will return here in several months for recheck. ? ?

## 2021-07-21 ENCOUNTER — Other Ambulatory Visit: Payer: Self-pay | Admitting: Family Medicine

## 2021-07-21 DIAGNOSIS — B001 Herpesviral vesicular dermatitis: Secondary | ICD-10-CM

## 2021-07-21 NOTE — Telephone Encounter (Signed)
[facility] . Please advise kh ?

## 2021-08-27 ENCOUNTER — Encounter: Payer: Self-pay | Admitting: Family Medicine

## 2021-08-27 ENCOUNTER — Ambulatory Visit: Payer: No Typology Code available for payment source | Admitting: Family Medicine

## 2021-08-27 VITALS — BP 136/88 | HR 69 | Temp 97.9°F | Wt 191.0 lb

## 2021-08-27 DIAGNOSIS — L989 Disorder of the skin and subcutaneous tissue, unspecified: Secondary | ICD-10-CM | POA: Diagnosis not present

## 2021-08-27 DIAGNOSIS — I10 Essential (primary) hypertension: Secondary | ICD-10-CM

## 2021-08-27 MED ORDER — METOPROLOL TARTRATE 50 MG PO TABS: 50.0000 mg | ORAL_TABLET | Freq: Two times a day (BID) | ORAL | 3 refills | Status: DC

## 2021-08-27 NOTE — Progress Notes (Signed)
   Subjective:    Patient ID: Adam Glover, male    DOB: 1971/03/06, 51 y.o.   MRN: [id number]  HPI He is here for recheck.  He has been on metoprolol 25 twice daily and had no difficulty with that.  He also has a lesion present on the right cheek that he would like evaluated.   Review of Systems     Objective:   Physical Exam Alert and in no distress.  Blood pressure is recorded.  A 1 cm raised beige appearing lesion on the right cheek is noted.       Assessment & Plan:  Benign skin lesion of cheek - Plan: Ambulatory referral to Dermatology  Essential hypertension - Plan: metoprolol tartrate (LOPRESSOR) 50 MG tablet Explained that the lesion could potentially be a skin cancer and since its on the face I will refer to dermatology. Increase the metoprolol.  He is to bring his blood pressure cuff in with him the next time to measure it against ours.

## 2021-11-11 ENCOUNTER — Encounter: Payer: Self-pay | Admitting: Internal Medicine

## 2021-12-15 ENCOUNTER — Encounter: Payer: Self-pay | Admitting: Internal Medicine

## 2021-12-28 ENCOUNTER — Encounter: Payer: Self-pay | Admitting: Internal Medicine

## 2022-01-26 ENCOUNTER — Ambulatory Visit
Admission: RE | Admit: 2022-01-26 | Discharge: 2022-01-26 | Disposition: A | Discharge status: Home / Self Care | Payer: No Typology Code available for payment source | Source: Ambulatory Visit | Attending: Nurse Practitioner | Admitting: Nurse Practitioner

## 2022-01-26 ENCOUNTER — Ambulatory Visit: Payer: No Typology Code available for payment source | Admitting: Nurse Practitioner

## 2022-01-26 ENCOUNTER — Other Ambulatory Visit: Payer: Self-pay

## 2022-01-26 ENCOUNTER — Encounter: Payer: Self-pay | Admitting: Nurse Practitioner

## 2022-01-26 VITALS — BP 138/86 | HR 84 | Temp 99.0°F | Resp 20 | Ht 75.0 in | Wt 196.4 lb

## 2022-01-26 DIAGNOSIS — K5900 Constipation, unspecified: Secondary | ICD-10-CM

## 2022-01-26 DIAGNOSIS — R1110 Vomiting, unspecified: Secondary | ICD-10-CM

## 2022-01-26 LAB — POC COVID19 BINAXNOW: SARS Coronavirus 2 Ag: NEGATIVE

## 2022-01-26 NOTE — Patient Instructions (Addendum)
I would like to get an x-ray of your abdomen to see if we can see if you are full of stool.   I want you to increase your water intake. This is very important to keep your bowels moving and keep them from getting hard and difficult to pass.   I will let you know once I get the results back if we want to try a bowel cleanout or if we need to look at other options based on what is seen.   I suspect a bowel cleanout will be needed, but I will let you know.

## 2022-01-26 NOTE — Progress Notes (Signed)
We will Tollie Eth, DNP, AGNP-c [facility] [address]  [address] [place], [place] [address] [phone]  Subjective:   Adam Glover is a 51 y.o. male presents to day for evaluation of: Constipation (Started with a what he calls a GI bug this past Friday. Did not have many bowel movements all weekend. Did not eat much all weekend. Sweating, thinks he has a low grade fever, feels bloated and he is stressing about not being able to have a good bowel movement. He thinks he has a fecal impaction and is in a lot of pain. )  1. Constipation, unspecified constipation type 2. Vomiting, unspecified vomiting type, unspecified whether nausea present Patient endorses having a "GI bug" over the weekend (Friday and Saturday) with distention feeling of fullness. He was unable to have a BM on Sunday, Monday, or today and tells me that he feels that there is a blockage that is preventing the digested food from passing through. He endorses a sensation of fullness and bloating. He tells me he does not feel hungry and is unable to force himself to even drink water due to concerns that it will not be able to move down and he will throw it up. He did have non bloody emesis over the weekend.  His mother did give him a dulcolax last night, but thus far this has not facilitated a bowel movement.  He endorses chills and a subjective low-grade fever through the night. He tells me that he is not feeling any movement through his intestines.  He endorses dull, achy, swollen, painful sensation throughout the abdomen.  He tells me the last time he had a good bowel movement was prior to becoming sick over the weekend.  PMH, Medications, and Allergies reviewed and updated in chart as appropriate.   ROS negative except for what is listed in HPI. Objective:  BP 138/86   Pulse 84   Temp 99 F (37.2 C) (Tympanic)   Resp 20   Ht 6\' 3"  (1.905 m)   Wt 196 lb 6.4 oz (89.1 kg)    BMI 24.55 kg/m  Physical Exam Vitals and nursing note reviewed.  Constitutional:      Appearance: Normal appearance.  HENT:     Head: Normocephalic.  Eyes:     Pupils: Pupils are equal, round, and reactive to light.  Cardiovascular:     Rate and Rhythm: Normal rate and regular rhythm.     Pulses: Normal pulses.     Heart sounds: Normal heart sounds.  Pulmonary:     Effort: Pulmonary effort is normal.     Breath sounds: Normal breath sounds.  Abdominal:     General: Bowel sounds are normal. There is no distension.     Tenderness: There is abdominal tenderness. There is guarding. There is no right CVA tenderness, left CVA tenderness or rebound.  Musculoskeletal:        General: Normal range of motion.  Skin:    General: Skin is warm and dry.     Capillary Refill: Capillary refill takes less than 2 seconds.     Coloration: Skin is not jaundiced.  Neurological:     General: No focal deficit present.     Mental Status: He is alert and oriented to person, place, and time.           Assessment & Plan:   Problem List Items Addressed This Visit     Constipation - Primary  Patient endorses symptoms of constipation with generalized abdominal tenderness present on examination. He does have a low grade temperature in the office today. Bowel sounds are active throughout and patient is able to pass gas and not passing completely liquid stool, which are reassuring against ileus. No evidence of straining or sensation for BM, so unlikely impaction present. Given patients limited intake due to GI distress over the weekend, I do feel that there may be a component of dehydration present that could be contributing.  At this time we will plan for abdominal x-ray to monitor for signs of increased stool.  There is no specific area of tenderness to indicate specific organ involvement.  We will determine what the x-ray shows and make changes to the plan of care based on these findings.  Patient  instructed to monitor for new or worsening symptoms and notify immediately.       Relevant Orders   POC COVID-19 (Completed)   DG Abd 2 Views (Completed)   Other Visit Diagnoses     Vomiting, unspecified vomiting type, unspecified whether nausea present       Relevant Orders   POC COVID-19 (Completed)   DG Abd 2 Views (Completed)        Tollie Eth, DNP, AGNP-c 02/10/2022  3:11 PM    History, Medications, Surgery, SDOH, and Family History reviewed and updated as appropriate.

## 2022-01-28 ENCOUNTER — Other Ambulatory Visit: Payer: Self-pay

## 2022-01-28 ENCOUNTER — Emergency Department (HOSPITAL_COMMUNITY)
Admission: EM | Admit: 2022-01-28 | Discharge: 2022-01-29 | Disposition: A | Discharge status: Home / Self Care | Payer: No Typology Code available for payment source | Attending: Emergency Medicine | Admitting: Emergency Medicine

## 2022-01-28 ENCOUNTER — Emergency Department (HOSPITAL_COMMUNITY): Payer: No Typology Code available for payment source

## 2022-01-28 DIAGNOSIS — R0789 Other chest pain: Secondary | ICD-10-CM | POA: Insufficient documentation

## 2022-01-28 DIAGNOSIS — Z79899 Other long term (current) drug therapy: Secondary | ICD-10-CM | POA: Diagnosis not present

## 2022-01-28 DIAGNOSIS — R079 Chest pain, unspecified: Secondary | ICD-10-CM | POA: Diagnosis present

## 2022-01-28 DIAGNOSIS — R072 Precordial pain: Secondary | ICD-10-CM

## 2022-01-28 DIAGNOSIS — I10 Essential (primary) hypertension: Secondary | ICD-10-CM | POA: Diagnosis not present

## 2022-01-28 NOTE — ED Triage Notes (Signed)
Patient coming to ED for evaluation of hypertension and "cold feeling going down my left side."  Reports increased stressors.  Ate too much "food with salt today at Thanksgiving."  States "when I was on my way back from Thanksgiving my mother and I got into it.  She really stresses me out and I clenched up.  After that I started having the coldest on my left side."  No reports of dizziness, facial weakness, difficulty with ambulation, slurred speech, or changes in vision.

## 2022-01-28 NOTE — ED Notes (Signed)
Pt transported to xray, tech notified to bring to room after completion of study

## 2022-01-29 LAB — BASIC METABOLIC PANEL
Anion gap: 5 (ref 5–15)
BUN: 22 mg/dL — ABNORMAL HIGH (ref 6–20)
CO2: 26 mmol/L (ref 22–32)
Calcium: 8.7 mg/dL — ABNORMAL LOW (ref 8.9–10.3)
Chloride: 105 mmol/L (ref 98–111)
Creatinine, Ser: 1 mg/dL (ref 0.61–1.24)
GFR, Estimated: >60 mL/min (ref >60)
Glucose, Bld: 137 mg/dL — ABNORMAL HIGH (ref 70–99)
Potassium: 4.1 mmol/L (ref 3.5–5.1)
Sodium: 136 mmol/L (ref 135–145)

## 2022-01-29 LAB — CBC
HCT: 37.8 % — ABNORMAL LOW (ref 39.0–52.0)
Hemoglobin: 13.4 g/dL (ref 13.0–17.0)
MCH: 34.5 pg — ABNORMAL HIGH (ref 26.0–34.0)
MCHC: 35.4 g/dL (ref 30.0–36.0)
MCV: 97.4 fL (ref 80.0–100.0)
Platelets: 175 10*3/uL (ref 150–400)
RBC: 3.88 MIL/uL — ABNORMAL LOW (ref 4.22–5.81)
RDW: 11.6 % (ref 11.5–15.5)
WBC: 4.5 10*3/uL (ref 4.0–10.5)
nRBC: 0 % (ref 0.0–0.2)

## 2022-01-29 LAB — TROPONIN I (HIGH SENSITIVITY)
Troponin I (High Sensitivity): 6 ng/L (ref <18)
Troponin I (High Sensitivity): 7 ng/L (ref <18)

## 2022-01-29 MED ORDER — ALUM & MAG HYDROXIDE-SIMETH 200-200-20 MG/5ML PO SUSP
30.0000 mL | Freq: Once | ORAL | Status: AC
  Administered 2022-01-29: 30 mL via ORAL
  Filled 2022-01-29: qty 30

## 2022-01-29 NOTE — ED Provider Notes (Signed)
[facility]-EMERGENCY DEPT Provider Note   CSN: [id number] Arrival date & time: 01/28/22  2309     History  Chief Complaint  Patient presents with   Hypertension    Adam Glover is a 51 y.o. male.  The history is provided by the patient.  Chest Pain Pain location:  L chest Pain quality: dull   Pain radiates to:  L arm Pain severity:  Moderate Onset quality:  Sudden Duration:  4 hours Timing:  Constant Progression:  Improving Chronicity:  New Context: at rest and stress   Context comment:  Disagreement with family Relieved by:  Nothing Worsened by:  Nothing Ineffective treatments:  None tried Associated symptoms: no cough, no diaphoresis, no fever, no palpitations, no shortness of breath, no syncope, no vomiting and no weakness   Associated symptoms comment:  Felt cold  Risk factors: male sex   Patient with HTN presents with L chest pain associated with a disagreement with family.  Also had a coldness and tight feeling on the left Arm.  No changes in vision or speech.  No exterional symptoms.   Past Medical History:  Diagnosis Date   Anal fissure    Diverticulosis of colon    hx/o diverticulitis x 1   Herpes labialis    Hypertension    Hypertriglyceridemia    IBS (irritable bowel syndrome)    Seborrheic dermatitis    Tinnitus    Vitamin D deficiency    WPW (Wolff-Parkinson-White syndrome) 1990   ablation       Home Medications Prior to Admission medications   Medication Sig Start Date End Date Taking? Authorizing Provider  Ascorbic Acid 500 MG CHEW Chew 1 tablet by mouth daily. Patient not taking: Reported on 01/26/2022    [provider]  bisacodyl (DULCOLAX) 5 MG EC tablet Take 5 mg by mouth daily as needed for moderate constipation.    [provider]  Cholecalciferol (VITAMIN D) 2000 units tablet Take 2,000 Units by mouth daily. Patient not taking: Reported on 01/26/2022    [provider]   metoprolol tartrate (LOPRESSOR) 50 MG tablet Take 1 tablet (50 mg total) by mouth 2 (two) times daily. 08/27/21   Ronnald Nian, MD  mupirocin nasal ointment (BACTROBAN NASAL) 2 % Place 1 application into the nose 2 (two) times daily. Use one-half of tube in each nostril twice daily for five (5) days. After application, press sides of nose together and gently massage. Patient not taking: Reported on 08/12/2020 10/23/18   Ronnald Nian, MD  rosuvastatin (CRESTOR) 10 MG tablet Take 1 tablet (10 mg total) by mouth daily. 05/21/21   Ronnald Nian, MD  sodium chloride (OCEAN) 0.65 % nasal spray Place into the nose. Patient not taking: Reported on 02/19/2021 08/13/20 08/13/21  [provider]  valACYclovir (VALTREX) 1000 MG tablet TAKE 2 TABLETS TWICE A DAY FOR 1 DAY Patient not taking: Reported on 01/26/2022 07/22/21   Ronnald Nian, MD  Zinc 30 MG TABS Take 1 tablet by mouth. Patient not taking: Reported on 05/21/2021    [provider]      Allergies    Ilosone [erythromycin estolate]    Review of Systems   Review of Systems  Constitutional:  Negative for diaphoresis and fever.  Respiratory:  Negative for cough, shortness of breath, wheezing and stridor.   Cardiovascular:  Positive for chest pain. Negative for palpitations and syncope.  Gastrointestinal:  Negative for vomiting.  Neurological:  Negative for  facial asymmetry, speech difficulty and weakness.    Physical Exam Updated Vital Signs BP 139/86   Pulse 70   Temp 98 F (36.7 C) (Oral)   Resp 16   SpO2 98%  Physical Exam Vitals and nursing note reviewed.  Constitutional:      General: He is not in acute distress.    Appearance: He is well-developed. He is not diaphoretic.  HENT:     Head: Normocephalic and atraumatic.     Nose: Nose normal.  Eyes:     Conjunctiva/sclera: Conjunctivae normal.     Pupils: Pupils are equal, round, and reactive to light.  Cardiovascular:     Rate and Rhythm: Normal rate and  regular rhythm.     Pulses: Normal pulses.     Heart sounds: Normal heart sounds.  Pulmonary:     Effort: Pulmonary effort is normal.     Breath sounds: Normal breath sounds. No wheezing or rales.  Abdominal:     General: Bowel sounds are normal.     Palpations: Abdomen is soft.     Tenderness: There is no abdominal tenderness. There is no guarding or rebound.  Musculoskeletal:        General: Normal range of motion.     Cervical back: Normal range of motion and neck supple.  Skin:    General: Skin is warm and dry.  Neurological:     General: No focal deficit present.     Mental Status: He is alert and oriented to person, place, and time.     Cranial Nerves: No cranial nerve deficit.     Sensory: No sensory deficit.     Motor: No weakness.     Deep Tendon Reflexes: Reflexes normal.  Psychiatric:        Mood and Affect: Mood is anxious.     ED Results / Procedures / Treatments   Labs (all labs ordered are listed, but only abnormal results are displayed) Results for orders placed or performed during the hospital encounter of 01/28/22  Basic metabolic panel  Result Value Ref Range   Sodium 136 135 - 145 mmol/L   Potassium 4.1 3.5 - 5.1 mmol/L   Chloride 105 98 - 111 mmol/L   CO2 26 22 - 32 mmol/L   Glucose, Bld 137 (H) 70 - 99 mg/dL   BUN 22 (H) 6 - 20 mg/dL   Creatinine, Ser 7.67 0.61 - 1.24 mg/dL   Calcium 8.7 (L) 8.9 - 10.3 mg/dL   GFR, Estimated >34 >19 mL/min   Anion gap 5 5 - 15  CBC  Result Value Ref Range   WBC 4.5 4.0 - 10.5 K/uL   RBC 3.88 (L) 4.22 - 5.81 MIL/uL   Hemoglobin 13.4 13.0 - 17.0 g/dL   HCT 37.9 (L) 02.4 - 09.7 %   MCV 97.4 80.0 - 100.0 fL   MCH 34.5 (H) 26.0 - 34.0 pg   MCHC 35.4 30.0 - 36.0 g/dL   RDW 35.3 29.9 - 24.2 %   Platelets 175 150 - 400 K/uL   nRBC 0.0 0.0 - 0.2 %  Troponin I (High Sensitivity)  Result Value Ref Range   Troponin I (High Sensitivity) 6 <18 ng/L  Troponin I (High Sensitivity)  Result Value Ref Range   Troponin I  (High Sensitivity) 7 <18 ng/L   DG Chest 2 View  Result Date: 01/29/2022 CLINICAL DATA:  Hypertension and left-sided chest discomfort. EXAM: CHEST - 2 VIEW COMPARISON:  Abdominal radiograph 01/26/2022, chest radiograph  08/19/2016 FINDINGS: Mild elevation of left hemidiaphragm, chronic. Adjacent atelectasis or scarring. The heart is normal in size with normal mediastinal contours. No confluent airspace disease, pleural effusion or pneumothorax. Air-fluid level within the stomach. IMPRESSION: Chronic elevation of left hemidiaphragm with adjacent atelectasis or scarring. Electronically Signed   By: Narda Rutherford M.D.   On: 01/29/2022 00:01   DG Abd 2 Views  Result Date: 01/28/2022 CLINICAL DATA:  Abdominal pain.  Constipation. EXAM: ABDOMEN - 2 VIEW COMPARISON:  None Available. FINDINGS: Mild fecal loading throughout the colon. No renal or ureteral stones identified. Lung bases are normal. No free air, portal venous gas, or pneumatosis. No bowel obstruction. No other significant abnormalities. IMPRESSION: Mild fecal loading throughout the colon. No other abnormalities. Electronically Signed   By: Gerome Sam III M.D.   On: 01/28/2022 13:11     EKG  EKG Interpretation  Date/Time:  Friday January 29 2022 02:44:48 EST Ventricular Rate:  72 PR Interval:  167 QRS Duration: 84 QT Interval:  396 QTC Calculation: 434 R Axis:   23 Text Interpretation: Sinus rhythm Atrial premature complex Confirmed by Alf Doyle ([id number]) on 01/29/2022 3:06:02 AM        Radiology DG Chest 2 View  Result Date: 01/29/2022 CLINICAL DATA:  Hypertension and left-sided chest discomfort. EXAM: CHEST - 2 VIEW COMPARISON:  Abdominal radiograph 01/26/2022, chest radiograph 08/19/2016 FINDINGS: Mild elevation of left hemidiaphragm, chronic. Adjacent atelectasis or scarring. The heart is normal in size with normal mediastinal contours. No confluent airspace disease, pleural effusion or pneumothorax. Air-fluid  level within the stomach. IMPRESSION: Chronic elevation of left hemidiaphragm with adjacent atelectasis or scarring. Electronically Signed   By: Narda Rutherford M.D.   On: 01/29/2022 00:01    Procedures Procedures    Medications Ordered in ED Medications  alum & mag hydroxide-simeth (MAALOX/MYLANTA) 200-200-20 MG/5ML suspension 30 mL (30 mLs Oral Given 01/29/22 0059)    ED Course/ Medical Decision Making/ A&P                           Medical Decision Making Patient with chest discomfort at rest while in a disagreement.    Amount and/or Complexity of Data Reviewed Independent Historian: parent    Details: See above  External Data Reviewed: notes.    Details: Previous notes reviewed  Labs: ordered.    Details: All labs reviewed: normal sodium 136, normal potassium 4.1, normal creatinine.  Normal troponins 6/7.  Normal white count 4.5, normal hemoglobin 13.4, normal platelet count Radiology: ordered and independent interpretation performed. Decision-making details documented in ED Course.    Details: Normal CXR by me  Risk OTC drugs. Risk Details: This likely all anxiety.  Sensation and motor are intact to all nerve distributions.  I do not believe this is a PE, symptoms are not consistent with this.  Pain was not pleuritic, no SOB.  Patient has ruled out for MI in the ED.  HEART score is 2 low risk for MACE    Final Clinical Impression(s) / ED Diagnoses Final diagnoses:  None  Return for intractable cough, coughing up blood, fevers > 100.4 unrelieved by medication, shortness of breath, intractable vomiting, chest pain, shortness of breath, weakness, numbness, changes in speech, facial asymmetry, abdominal pain, passing out, Inability to tolerate liquids or food, cough, altered mental status or any concerns. No signs of systemic illness or infection. The patient is nontoxic-appearing on exam and vital signs are within normal limits.  I have reviewed the triage vital signs and the  nursing notes. Pertinent labs & imaging results that were available during my care of the patient were reviewed by me and considered in my medical decision making (see chart for details). After history, exam, and medical workup I feel the patient has been appropriately medically screened and is safe for discharge home. Pertinent diagnoses were discussed with the patient. Patient was given return precautions.     Rx / DC Orders ED Discharge Orders     None         Dwane Andres, MD 01/29/22 [id number]

## 2022-02-01 ENCOUNTER — Other Ambulatory Visit: Payer: Self-pay | Admitting: Family Medicine

## 2022-02-01 DIAGNOSIS — I10 Essential (primary) hypertension: Secondary | ICD-10-CM

## 2022-02-01 MED ORDER — METOPROLOL TARTRATE 50 MG PO TABS: 50.0000 mg | ORAL_TABLET | Freq: Two times a day (BID) | ORAL | 1 refills | Status: DC

## 2022-02-01 NOTE — Telephone Encounter (Signed)
Transition Care Management Follow-up Telephone Call Date of discharge and from where: 01/29/2022 [facility] How have you been since you were released from the hospital? ok Any questions or concerns? No  Items Reviewed: Did the pt receive and understand the discharge instructions provided? Yes  Medications obtained and verified? Yes  Other? Yes  Any new allergies since your discharge? No  Dietary orders reviewed? Yes Do you have support at home? Yes   Home Care and Equipment/Supplies: Were home health services ordered? not applicable   Follow up appointments reviewed:  PCP Hospital f/u appt confirmed? Yes  Scheduled to see Dr. Susann Givens on 02/03/2022 @ 2:30.. Are transportation arrangements needed? No  If their condition worsens, is the pt aware to call PCP or go to the Emergency Dept.? Yes Was the patient provided with contact information for the PCP's office or ED? Yes Was to pt encouraged to call back with questions or concerns? Yes

## 2022-02-03 ENCOUNTER — Encounter: Payer: Self-pay | Admitting: Family Medicine

## 2022-02-03 ENCOUNTER — Ambulatory Visit: Payer: No Typology Code available for payment source | Admitting: Family Medicine

## 2022-02-03 VITALS — BP 132/84 | HR 80 | Temp 98.5°F | Wt 194.4 lb

## 2022-02-03 DIAGNOSIS — R079 Chest pain, unspecified: Secondary | ICD-10-CM | POA: Diagnosis not present

## 2022-02-03 DIAGNOSIS — Z638 Other specified problems related to primary support group: Secondary | ICD-10-CM

## 2022-02-03 NOTE — Patient Instructions (Signed)
Try [facility] to get involved in some counseling

## 2022-02-03 NOTE — Progress Notes (Signed)
Do not forget Montgomery K thanks  Subjective:    Patient ID: Adam Glover, male    DOB: December 03, 1970, 51 y.o.   MRN: [id number]  HPI He is here for a recheck.  He was recently in the hospital for evaluation of chest pain.  He also complained of numbness to the entire left side of his body but this occurred when he and his mother were having a fight.  Apparently there are issues with both of them being indecisive on matters.  He also has been having some financial issues as he is not driving at the present time due to not having a vehicle.  He does complain of a burning sensation in his hand but not in a particular nerve root distribution.   Review of Systems     Objective:   Physical Exam Alert and in no distress.  Cardiac exam shows regular rhythm without murmurs gallops.  Lungs are clear to auscultation.  Normal sensation and strength of his left hand       Assessment & Plan:  Chest pain, unspecified type  Stress due to family tension I reviewed the emergency room report with him and reassured him that nothing was found of any major significance.  Explained that the symptoms are probably psychophysiologic in nature due to the stress that he is under.  Strongly encouraged him to get involved in counseling.  Did recommend 2 different groups to check into to get counseling.  Also discussed the possibility of getting a Publishing rights manager for his mother to let someone neutral help with his finances.

## 2022-02-10 DIAGNOSIS — K5900 Constipation, unspecified: Secondary | ICD-10-CM | POA: Insufficient documentation

## 2022-02-10 NOTE — Assessment & Plan Note (Signed)
Patient endorses symptoms of constipation with generalized abdominal tenderness present on examination. He does have a low grade temperature in the office today. Bowel sounds are active throughout and patient is able to pass gas and not passing completely liquid stool, which are reassuring against ileus. No evidence of straining or sensation for BM, so unlikely impaction present. Given patients limited intake due to GI distress over the weekend, I do feel that there may be a component of dehydration present that could be contributing.  At this time we will plan for abdominal x-ray to monitor for signs of increased stool.  There is no specific area of tenderness to indicate specific organ involvement.  We will determine what the x-ray shows and make changes to the plan of care based on these findings.  Patient instructed to monitor for new or worsening symptoms and notify immediately.

## 2022-05-31 ENCOUNTER — Telehealth: Payer: Self-pay | Admitting: Family Medicine

## 2022-05-31 NOTE — Telephone Encounter (Signed)
Adam Glover walked in and states he has been having sinus pressure in head and ears are clogged (nose pathway is clear). He didn't want to schedule an appointment but asked if you have any recommendations for him.

## 2022-06-01 NOTE — Telephone Encounter (Signed)
Left detailed message for pt 

## 2022-06-04 ENCOUNTER — Other Ambulatory Visit: Payer: Self-pay | Admitting: Family Medicine

## 2022-06-04 DIAGNOSIS — E785 Hyperlipidemia, unspecified: Secondary | ICD-10-CM

## 2022-06-04 DIAGNOSIS — E781 Pure hyperglyceridemia: Secondary | ICD-10-CM

## 2022-07-22 ENCOUNTER — Other Ambulatory Visit: Payer: Self-pay | Admitting: Family Medicine

## 2022-07-22 DIAGNOSIS — B001 Herpesviral vesicular dermatitis: Secondary | ICD-10-CM

## 2022-07-22 MED ORDER — VALACYCLOVIR HCL 1 G PO TABS: ORAL_TABLET | ORAL | 0 refills | Status: DC

## 2022-07-22 NOTE — Telephone Encounter (Signed)
Is this okay to refill? 

## 2022-09-04 ENCOUNTER — Other Ambulatory Visit: Payer: Self-pay | Admitting: Family Medicine

## 2022-09-04 DIAGNOSIS — E785 Hyperlipidemia, unspecified: Secondary | ICD-10-CM

## 2022-09-04 DIAGNOSIS — E781 Pure hyperglyceridemia: Secondary | ICD-10-CM

## 2022-09-23 ENCOUNTER — Other Ambulatory Visit: Payer: Self-pay | Admitting: Family Medicine

## 2022-09-23 DIAGNOSIS — I10 Essential (primary) hypertension: Secondary | ICD-10-CM

## 2022-09-24 NOTE — Telephone Encounter (Signed)
Pt was increased to 50mg 

## 2022-10-15 ENCOUNTER — Other Ambulatory Visit: Payer: Self-pay | Admitting: Family Medicine

## 2022-10-15 DIAGNOSIS — E781 Pure hyperglyceridemia: Secondary | ICD-10-CM

## 2022-10-15 DIAGNOSIS — E785 Hyperlipidemia, unspecified: Secondary | ICD-10-CM

## 2022-11-22 ENCOUNTER — Ambulatory Visit: Payer: No Typology Code available for payment source | Admitting: Medical

## 2022-11-23 ENCOUNTER — Ambulatory Visit: Payer: BLUE CROSS/BLUE SHIELD | Admitting: Medical

## 2022-11-23 VITALS — BP 130/84 | HR 71 | Temp 97.7°F | Wt 205.8 lb

## 2022-11-23 DIAGNOSIS — S93492A Sprain of other ligament of left ankle, initial encounter: Secondary | ICD-10-CM

## 2022-11-23 NOTE — Progress Notes (Signed)
Subjective:  Adam Glover is a 52 y.o. male who presents for Chief Complaint  Patient presents with   Ankle Pain    Ankle pain, soreness on Thursday, swelling started Friday,- some better but just wanted to follow-up     Here for follow up on issue he had last week.   He notes left ankle swelling and pain that started last week about 3 days ago.  Doesn't recall injury or fall.  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  Has improved some with time, but not 100% back to normal. He has worked 17 days in a row recently without a day off.  no other aggravating or relieving factors.    No other c/o.  Past Medical History:  Diagnosis Date   Anal fissure    Diverticulosis of colon    hx/o diverticulitis x 1   Herpes labialis    Hypertension    Hypertriglyceridemia    IBS (irritable bowel syndrome)    Seborrheic dermatitis    Tinnitus    Vitamin D deficiency    WPW (Wolff-Parkinson-White syndrome) 1990   ablation   Current Outpatient Medications on File Prior to Visit  Medication Sig Dispense Refill   Ascorbic Acid 500 MG CHEW Chew 1 tablet by mouth daily.     Cholecalciferol (VITAMIN D) 2000 units tablet Take 2,000 Units by mouth daily.     metoprolol tartrate (LOPRESSOR) 50 MG tablet Take 1 tablet (50 mg total) by mouth 2 (two) times daily. 180 tablet 1   rosuvastatin (CRESTOR) 10 MG tablet TAKE 1 TABLET BY MOUTH EVERY DAY 90 tablet 0   valACYclovir (VALTREX) 1000 MG tablet TAKE 2 TABLETS TWICE A DAY FOR 1 DAY 20 tablet 0   bisacodyl (DULCOLAX) 5 MG EC tablet Take 5 mg by mouth daily as needed for moderate constipation. (Patient not taking: Reported on 11/23/2022)     No current facility-administered medications on file prior to visit.     The following portions of the patient's history were reviewed and updated as appropriate: allergies, current medications, past family history, past medical history, past social history, past surgical  history and problem list.  ROS Otherwise as in subjective above  Objective: BP 130/84   Pulse 71   Temp 97.7 F (36.5 C)   Wt 205 lb 12.8 oz (93.4 kg)   BMI 25.72 kg/m   General appearance: alert, no distress, well developed, well nourished MSK: Mild tenderness over the left posterior talofibular ligament of the ankle, and there is slight puffy swelling of the lateral ankle otherwise nontender with normal range of motion.  No tenderness over the lateral malleolus. Strength and sensation normal Legs neurovascularly intact Pulses: 2+ radial pulses, 2+ pedal pulses, normal cap refill Ext: no edema   Assessment: Encounter Diagnosis  Name Primary?   Sprain of posterior talofibular ligament of left ankle, initial encounter Yes     Plan: Discussed symptoms, concerns, and recommendations as below.  Patient Instructions  Your symptoms and exam suggest a mild ankle sprain  Recommendations: Use over-the-counter Aleve twice daily for the next 3 to 5 days for pain and inflammation Use cold therapy such as a bucket of cold water 20 minutes at a time twice daily for the next 3 to 5 days Consider an ankle sleeve for compression particularly while you are at work on your feet for hours Use some stretching and strengthening exercises I showed you today  I suspect symptoms should gradually improve over the next week as they already are looking better at this point If new or different symptoms are not improving  then let us know    Verbon was seen today for ankle pain.  Diagnoses and all orders for this visit:  Sprain of posterior talofibular ligament of left ankle, initial encounter    Follow up: prn

## 2022-11-23 NOTE — Patient Instructions (Signed)
Your symptoms and exam suggest a mild ankle sprain  Recommendations: Use over-the-counter Aleve twice daily for the next 3 to 5 days for pain and inflammation Use cold therapy such as a bucket of cold water 20 minutes at a time twice daily for the next 3 to 5 days Consider an ankle sleeve for compression particularly while you are at work on your feet for hours Use some stretching and strengthening exercises I showed you today I suspect symptoms should gradually improve over the next week as they already are looking better at this point If new or different symptoms are not improving  then let us know

## 2022-12-18 ENCOUNTER — Other Ambulatory Visit: Payer: Self-pay | Admitting: Family Medicine

## 2022-12-18 DIAGNOSIS — I10 Essential (primary) hypertension: Secondary | ICD-10-CM

## 2023-02-03 ENCOUNTER — Other Ambulatory Visit: Payer: Self-pay | Admitting: Family Medicine

## 2023-02-03 DIAGNOSIS — E785 Hyperlipidemia, unspecified: Secondary | ICD-10-CM

## 2023-02-03 DIAGNOSIS — E781 Pure hyperglyceridemia: Secondary | ICD-10-CM

## 2023-02-09 ENCOUNTER — Telehealth: Payer: Self-pay | Admitting: Family Medicine

## 2023-02-09 NOTE — Telephone Encounter (Signed)
Pt wants to know what anti inflammatory he can take with his current meds

## 2023-02-14 NOTE — Telephone Encounter (Signed)
Pt.notified

## 2023-03-25 ENCOUNTER — Other Ambulatory Visit: Payer: Self-pay | Admitting: Family Medicine

## 2023-03-25 DIAGNOSIS — I10 Essential (primary) hypertension: Secondary | ICD-10-CM

## 2023-04-20 ENCOUNTER — Ambulatory Visit: Payer: BLUE CROSS/BLUE SHIELD | Admitting: Family Medicine

## 2023-04-20 ENCOUNTER — Encounter: Payer: Self-pay | Admitting: Family Medicine

## 2023-04-20 VITALS — BP 130/82 | HR 73 | Ht 75.5 in | Wt 215.6 lb

## 2023-04-20 DIAGNOSIS — I10 Essential (primary) hypertension: Secondary | ICD-10-CM

## 2023-04-20 DIAGNOSIS — Z0000 Encounter for general adult medical examination without abnormal findings: Secondary | ICD-10-CM

## 2023-04-20 DIAGNOSIS — I456 Pre-excitation syndrome: Secondary | ICD-10-CM

## 2023-04-20 DIAGNOSIS — B001 Herpesviral vesicular dermatitis: Secondary | ICD-10-CM

## 2023-04-20 DIAGNOSIS — Z23 Encounter for immunization: Secondary | ICD-10-CM | POA: Diagnosis not present

## 2023-04-20 DIAGNOSIS — E785 Hyperlipidemia, unspecified: Secondary | ICD-10-CM | POA: Diagnosis not present

## 2023-04-20 DIAGNOSIS — E781 Pure hyperglyceridemia: Secondary | ICD-10-CM

## 2023-04-20 DIAGNOSIS — Z833 Family history of diabetes mellitus: Secondary | ICD-10-CM | POA: Diagnosis not present

## 2023-04-20 DIAGNOSIS — H9313 Tinnitus, bilateral: Secondary | ICD-10-CM

## 2023-04-20 MED ORDER — METOPROLOL TARTRATE 50 MG PO TABS: 50.0000 mg | ORAL_TABLET | Freq: Two times a day (BID) | ORAL | 0 refills | Status: DC

## 2023-04-20 MED ORDER — ROSUVASTATIN CALCIUM 10 MG PO TABS: 10.0000 mg | ORAL_TABLET | Freq: Every day | ORAL | 0 refills | Status: DC

## 2023-04-20 MED ORDER — VALACYCLOVIR HCL 1 G PO TABS: ORAL_TABLET | ORAL | 0 refills | Status: DC

## 2023-04-20 NOTE — Progress Notes (Signed)
Complete physical exam  Patient: Adam Glover   DOB: [birth year]   53 y.o. Male  MRN: [id number]  Subjective:    Chief Complaint  Patient presents with   Annual Exam    Fasting    Adam Glover is a 53 y.o. male who presents today for a complete physical exam. He reports consuming a general and avoiding extra salt  diet. Home exercise routine includes walking 2 hrs per week. He generally feels fairly well. He reports sleeping poorly. He does not have additional problems to discuss today.  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  He has had no troubles with any heart irregularities.  Continues on Crestor.  He also takes Valtrex on an as-needed basis.  He has had tenderness to this his entire life.  He does not smoke or drink and is not sexually active.  His work keeps him busy and he also helps take care of his mother.  Most recent fall risk assessment:    04/20/2023    3:37 PM  Fall Risk   Falls in the past year? 0  Number falls in past yr: 0  Injury with Fall? 0     Most recent depression screenings:    04/20/2023    3:37 PM 02/19/2021    3:10 PM  PHQ 2/9 Scores  PHQ - 2 Score 0 0    Vision:Not within last year  and Dental: No current dental problems and Last dental visit: December 2024    Patient Care Team: Ronnald Nian, MD as PCP - General (Family Medicine) Marinus Maw, MD as PCP - Electrophysiology (Cardiology)   Outpatient Medications Prior to Visit  Medication Sig   Ascorbic Acid (VITAMIN C) 1000 MG tablet Take 1,000 mg by mouth daily.   Vitamin D, Cholecalciferol, 25 MCG (1000 UT) CAPS Take 1 capsule by mouth. Nature Made Vitamin D3   Zinc 30 MG TABS Take 1 tablet by mouth daily.   [DISCONTINUED] Ascorbic Acid 500 MG CHEW Chew 1 tablet by mouth daily.   [DISCONTINUED] Cholecalciferol (VITAMIN D) 2000 units tablet Take 2,000 Units by mouth daily.   [DISCONTINUED] valACYclovir (VALTREX) 1000 MG tablet TAKE 2 TABLETS TWICE A  DAY FOR 1 DAY   bisacodyl (DULCOLAX) 5 MG EC tablet Take 5 mg by mouth daily as needed for moderate constipation. (Patient not taking: Reported on 04/20/2023)   [DISCONTINUED] metoprolol tartrate (LOPRESSOR) 50 MG tablet TAKE 1 TABLET BY MOUTH TWICE A DAY   [DISCONTINUED] rosuvastatin (CRESTOR) 10 MG tablet TAKE 1 TABLET BY MOUTH EVERY DAY   No facility-administered medications prior to visit.    Review of Systems  All other systems reviewed and are negative.  Otherwise family and social history as well as health maintenance and immunizations was reviewed       Objective:        Physical Exam  Alert and in no distress. Tympanic membranes and canals are normal. Pharyngeal area is normal. Neck is supple without adenopathy or thyromegaly. Cardiac exam shows a regular sinus rhythm without murmurs or gallops. Lungs are clear to auscultation.      Assessment & Plan:    Routine general medical examination at a health care facility  WPW syndrome  Family history of diabetes mellitus (DM)  Tinnitus of both ears  Essential hypertension - Plan: metoprolol tartrate (LOPRESSOR) 50 MG tablet, CBC with Differential/Platelet, Comprehensive metabolic panel  Hyperlipidemia, unspecified hyperlipidemia type - Plan: rosuvastatin (  CRESTOR) 10 MG tablet, Lipid panel  Herpes labialis - Plan: valACYclovir (VALTREX) 1000 MG tablet  Need for shingles vaccine - Plan: Varicella-zoster vaccine IM  Immunization History  Administered Date(s) Administered   Hepatitis B 05/21/1999   Influenza Whole 06/13/2009   Influenza, Seasonal, Injecte, Preservative Fre 03/20/2012   Tdap 06/13/2009, 02/19/2021    Health Maintenance  Topic Date Due   Zoster Vaccines- Shingrix (1 of 2) Never done   INFLUENZA VACCINE  10/07/2022   COVID-19 Vaccine (1) 05/06/2023 (Originally 07/09/1975)   Fecal DNA (Cologuard)  03/09/2024   DTaP/Tdap/Td (3 - Td or Tdap) 02/20/2031   Hepatitis C Screening  Completed   HIV  Screening  Completed   HPV VACCINES  Aged Out    Discussed health benefits of physical activity, and encouraged him to engage in regular exercise appropriate for his age and condition.  Problem List Items Addressed This Visit     Family history of diabetes mellitus (DM)   Tinnitus of both ears   WPW syndrome   Relevant Medications   metoprolol tartrate (LOPRESSOR) 50 MG tablet   rosuvastatin (CRESTOR) 10 MG tablet   Other Visit Diagnoses       Routine general medical examination at a health care facility    -  Primary     Essential hypertension       Relevant Medications   metoprolol tartrate (LOPRESSOR) 50 MG tablet   rosuvastatin (CRESTOR) 10 MG tablet   Other Relevant Orders   CBC with Differential/Platelet   Comprehensive metabolic panel     Hyperlipidemia, unspecified hyperlipidemia type       Relevant Medications   metoprolol tartrate (LOPRESSOR) 50 MG tablet   rosuvastatin (CRESTOR) 10 MG tablet   Other Relevant Orders   Lipid panel     Herpes labialis       Relevant Medications   valACYclovir (VALTREX) 1000 MG tablet     Need for shingles vaccine       Relevant Orders   Varicella-zoster vaccine IM      Follow-up in 1 year also return here for second shingles shot.    Sharlot Gowda, MD

## 2023-04-21 ENCOUNTER — Encounter: Payer: Self-pay | Admitting: Family Medicine

## 2023-04-21 LAB — CBC WITH DIFFERENTIAL/PLATELET
Basophils Absolute: 0.1 10*3/uL (ref 0.0–0.2)
Basos: 1 %
EOS (ABSOLUTE): 0.1 10*3/uL (ref 0.0–0.4)
Eos: 2 %
Hematocrit: 43.9 % (ref 37.5–51.0)
Hemoglobin: 15.2 g/dL (ref 13.0–17.7)
Immature Grans (Abs): 0 10*3/uL (ref 0.0–0.1)
Immature Granulocytes: 0 %
Lymphocytes Absolute: 2.8 10*3/uL (ref 0.7–3.1)
Lymphs: 48 %
MCH: 34.2 pg — ABNORMAL HIGH (ref 26.6–33.0)
MCHC: 34.6 g/dL (ref 31.5–35.7)
MCV: 99 fL — ABNORMAL HIGH (ref 79–97)
Monocytes Absolute: 0.4 10*3/uL (ref 0.1–0.9)
Monocytes: 7 %
Neutrophils Absolute: 2.4 10*3/uL (ref 1.4–7.0)
Neutrophils: 42 %
Platelets: 154 10*3/uL (ref 150–450)
RBC: 4.45 x10E6/uL (ref 4.14–5.80)
RDW: 12.2 % (ref 11.6–15.4)
WBC: 5.8 10*3/uL (ref 3.4–10.8)

## 2023-04-21 LAB — LIPID PANEL
Chol/HDL Ratio: 3.5 {ratio} (ref 0.0–5.0)
Cholesterol, Total: 145 mg/dL (ref 100–199)
HDL: 41 mg/dL (ref >39)
LDL Chol Calc (NIH): 74 mg/dL (ref 0–99)
Triglycerides: 178 mg/dL — ABNORMAL HIGH (ref 0–149)
VLDL Cholesterol Cal: 30 mg/dL (ref 5–40)

## 2023-04-21 LAB — COMPREHENSIVE METABOLIC PANEL
ALT: 24 [IU]/L (ref 0–44)
AST: 23 [IU]/L (ref 0–40)
Albumin: 4.6 g/dL (ref 3.8–4.9)
Alkaline Phosphatase: 101 [IU]/L (ref 44–121)
BUN/Creatinine Ratio: 14 (ref 9–20)
BUN: 14 mg/dL (ref 6–24)
Bilirubin Total: 0.6 mg/dL (ref 0.0–1.2)
CO2: 23 mmol/L (ref 20–29)
Calcium: 9.6 mg/dL (ref 8.7–10.2)
Chloride: 101 mmol/L (ref 96–106)
Creatinine, Ser: 0.97 mg/dL (ref 0.76–1.27)
Globulin, Total: 2.3 g/dL (ref 1.5–4.5)
Glucose: 87 mg/dL (ref 70–99)
Potassium: 4.5 mmol/L (ref 3.5–5.2)
Sodium: 140 mmol/L (ref 134–144)
Total Protein: 6.9 g/dL (ref 6.0–8.5)
eGFR: 93 mL/min/{1.73_m2} (ref >59)

## 2023-05-03 ENCOUNTER — Encounter: Payer: Self-pay | Admitting: Internal Medicine

## 2023-05-03 ENCOUNTER — Encounter: Payer: BLUE CROSS/BLUE SHIELD | Admitting: Family Medicine

## 2023-07-14 ENCOUNTER — Other Ambulatory Visit (INDEPENDENT_AMBULATORY_CARE_PROVIDER_SITE_OTHER): Payer: BLUE CROSS/BLUE SHIELD

## 2023-07-14 DIAGNOSIS — Z23 Encounter for immunization: Secondary | ICD-10-CM | POA: Diagnosis not present

## 2023-08-11 ENCOUNTER — Other Ambulatory Visit: Payer: Self-pay | Admitting: Family Medicine

## 2023-08-11 DIAGNOSIS — E785 Hyperlipidemia, unspecified: Secondary | ICD-10-CM

## 2023-10-05 ENCOUNTER — Other Ambulatory Visit: Payer: Self-pay | Admitting: Family Medicine

## 2023-10-05 DIAGNOSIS — I10 Essential (primary) hypertension: Secondary | ICD-10-CM

## 2023-11-20 ENCOUNTER — Other Ambulatory Visit: Payer: Self-pay | Admitting: Family Medicine

## 2023-11-20 DIAGNOSIS — E785 Hyperlipidemia, unspecified: Secondary | ICD-10-CM

## 2024-01-12 ENCOUNTER — Other Ambulatory Visit: Payer: Self-pay | Admitting: Family Medicine

## 2024-01-12 DIAGNOSIS — I10 Essential (primary) hypertension: Secondary | ICD-10-CM

## 2024-02-14 ENCOUNTER — Other Ambulatory Visit: Payer: Self-pay | Admitting: Family Medicine

## 2024-02-14 DIAGNOSIS — B001 Herpesviral vesicular dermatitis: Secondary | ICD-10-CM

## 2024-02-26 ENCOUNTER — Other Ambulatory Visit: Payer: Self-pay | Admitting: Family Medicine

## 2024-02-26 DIAGNOSIS — E785 Hyperlipidemia, unspecified: Secondary | ICD-10-CM

## 2024-04-11 ENCOUNTER — Other Ambulatory Visit: Payer: Self-pay | Admitting: Family Medicine

## 2024-04-11 DIAGNOSIS — I10 Essential (primary) hypertension: Secondary | ICD-10-CM

## 2024-04-20 ENCOUNTER — Encounter: Payer: BLUE CROSS/BLUE SHIELD | Admitting: Family Medicine
